# Patient Record
Sex: Female | Born: 1980 | Race: Asian | Hispanic: No | Marital: Married | Smoking: Never smoker
Health system: Southern US, Community
[De-identification: ages and names within clinical notes are randomized; demographics above are authoritative.]

## PROBLEM LIST (undated history)

## (undated) ENCOUNTER — Inpatient Hospital Stay (HOSPITAL_COMMUNITY): Payer: Self-pay

## (undated) DIAGNOSIS — M069 Rheumatoid arthritis, unspecified: Secondary | ICD-10-CM

## (undated) HISTORY — DX: Rheumatoid arthritis, unspecified: M06.9

---

## 2012-10-16 LAB — OB RESULTS CONSOLE GC/CHLAMYDIA
Chlamydia: NEGATIVE
Gonorrhea: NEGATIVE

## 2012-11-18 LAB — OB RESULTS CONSOLE ABO/RH: RH TYPE: POSITIVE

## 2012-11-18 LAB — OB RESULTS CONSOLE ANTIBODY SCREEN: Antibody Screen: NEGATIVE

## 2012-11-18 LAB — OB RESULTS CONSOLE HEPATITIS B SURFACE ANTIGEN: HEP B S AG: NEGATIVE

## 2012-11-18 LAB — OB RESULTS CONSOLE HIV ANTIBODY (ROUTINE TESTING): HIV: NONREACTIVE

## 2012-11-18 LAB — OB RESULTS CONSOLE RUBELLA ANTIBODY, IGM: RUBELLA: IMMUNE

## 2012-11-18 LAB — OB RESULTS CONSOLE RPR: RPR: NONREACTIVE

## 2013-03-25 ENCOUNTER — Inpatient Hospital Stay (HOSPITAL_COMMUNITY): Payer: BC Managed Care – PPO

## 2013-03-25 ENCOUNTER — Encounter (HOSPITAL_COMMUNITY): Payer: Self-pay

## 2013-03-25 ENCOUNTER — Inpatient Hospital Stay (HOSPITAL_COMMUNITY)
Admission: AD | Admit: 2013-03-25 | Discharge: 2013-03-25 | Disposition: A | Discharge status: Home / Self Care | Payer: BC Managed Care – PPO | Source: Ambulatory Visit | Attending: Obstetrics and Gynecology | Admitting: Obstetrics and Gynecology

## 2013-03-25 DIAGNOSIS — M549 Dorsalgia, unspecified: Secondary | ICD-10-CM

## 2013-03-25 DIAGNOSIS — O9989 Other specified diseases and conditions complicating pregnancy, childbirth and the puerperium: Secondary | ICD-10-CM

## 2013-03-25 DIAGNOSIS — M545 Low back pain, unspecified: Secondary | ICD-10-CM | POA: Insufficient documentation

## 2013-03-25 DIAGNOSIS — O99891 Other specified diseases and conditions complicating pregnancy: Secondary | ICD-10-CM | POA: Insufficient documentation

## 2013-03-25 DIAGNOSIS — Y92410 Unspecified street and highway as the place of occurrence of the external cause: Secondary | ICD-10-CM | POA: Insufficient documentation

## 2013-03-25 MED ORDER — CYCLOBENZAPRINE HCL 10 MG PO TABS: 10.0000 mg | ORAL_TABLET | Freq: Every day | ORAL | Status: DC

## 2013-03-25 MED ORDER — ACETAMINOPHEN 325 MG PO TABS
650.0000 mg | ORAL_TABLET | Freq: Once | ORAL | Status: AC
  Administered 2013-03-25: 650 mg via ORAL
  Filled 2013-03-25: qty 2
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
## 2013-05-01 LAB — OB RESULTS CONSOLE GBS: GBS: NEGATIVE

## 2013-05-25 ENCOUNTER — Encounter (HOSPITAL_COMMUNITY): Payer: Self-pay | Admitting: *Deleted

## 2013-05-25 ENCOUNTER — Inpatient Hospital Stay (HOSPITAL_COMMUNITY)
Admission: AD | Admit: 2013-05-25 | Discharge: 2013-05-28 | DRG: 765 | Disposition: A | Discharge status: Home / Self Care | Payer: BC Managed Care – PPO | Source: Ambulatory Visit | Attending: Obstetrics and Gynecology | Admitting: Obstetrics and Gynecology

## 2013-05-25 DIAGNOSIS — O479 False labor, unspecified: Secondary | ICD-10-CM

## 2013-05-25 DIAGNOSIS — Z9889 Other specified postprocedural states: Secondary | ICD-10-CM

## 2013-05-25 DIAGNOSIS — O4290 Premature rupture of membranes, unspecified as to length of time between rupture and onset of labor, unspecified weeks of gestation: Secondary | ICD-10-CM | POA: Diagnosis present

## 2013-05-25 LAB — CBC
HCT: 35.3 % — ABNORMAL LOW (ref 36.0–46.0)
Hemoglobin: 11.8 g/dL — ABNORMAL LOW (ref 12.0–15.0)
MCH: 27.9 pg (ref 26.0–34.0)
MCHC: 33.4 g/dL (ref 30.0–36.0)
MCV: 83.5 fL (ref 78.0–100.0)
PLATELETS: 176 10*3/uL (ref 150–400)
RBC: 4.23 MIL/uL (ref 3.87–5.11)
RDW: 13.1 % (ref 11.5–15.5)
WBC: 6.2 10*3/uL (ref 4.0–10.5)

## 2013-05-25 LAB — POCT FERN TEST: POCT FERN TEST: POSITIVE

## 2013-05-25 MED ORDER — LIDOCAINE HCL (PF) 1 % IJ SOLN: 30.0000 mL | INTRAMUSCULAR | Status: DC | PRN

## 2013-05-25 MED ORDER — OXYTOCIN BOLUS FROM INFUSION: 500.0000 mL | INTRAVENOUS | Status: DC

## 2013-05-25 MED ORDER — LACTATED RINGERS IV SOLN
500.0000 mL | INTRAVENOUS | Status: DC | PRN
  Administered 2013-05-26 (×3): 300 mL via INTRAVENOUS

## 2013-05-25 MED ORDER — OXYCODONE-ACETAMINOPHEN 5-325 MG PO TABS: 1.0000 | ORAL_TABLET | ORAL | Status: DC | PRN

## 2013-05-25 MED ORDER — CITRIC ACID-SODIUM CITRATE 334-500 MG/5ML PO SOLN
30.0000 mL | ORAL | Status: DC | PRN
  Administered 2013-05-26: 30 mL via ORAL
  Filled 2013-05-25: qty 15

## 2013-05-25 MED ORDER — LACTATED RINGERS IV SOLN
INTRAVENOUS | Status: DC
  Administered 2013-05-25 – 2013-05-26 (×6): via INTRAVENOUS

## 2013-05-25 MED ORDER — ACETAMINOPHEN 325 MG PO TABS: 650.0000 mg | ORAL_TABLET | ORAL | Status: DC | PRN

## 2013-05-25 MED ORDER — OXYTOCIN 40 UNITS IN LACTATED RINGERS INFUSION - SIMPLE MED
1.0000 m[IU]/min | INTRAVENOUS | Status: DC
  Administered 2013-05-25: 2 m[IU]/min via INTRAVENOUS
  Filled 2013-05-25: qty 1000

## 2013-05-25 MED ORDER — ONDANSETRON HCL 4 MG/2ML IJ SOLN: 4.0000 mg | Freq: Four times a day (QID) | INTRAMUSCULAR | Status: DC | PRN

## 2013-05-25 MED ORDER — OXYTOCIN 40 UNITS IN LACTATED RINGERS INFUSION - SIMPLE MED: 62.5000 mL/h | INTRAVENOUS | Status: DC

## 2013-05-25 MED ORDER — IBUPROFEN 600 MG PO TABS: 600.0000 mg | ORAL_TABLET | Freq: Four times a day (QID) | ORAL | Status: DC | PRN

## 2013-05-25 MED ORDER — TERBUTALINE SULFATE 1 MG/ML IJ SOLN: 0.2500 mg | Freq: Once | INTRAMUSCULAR | Status: AC | PRN

## 2013-05-25 MED ORDER — FLEET ENEMA 7-19 GM/118ML RE ENEM: 1.0000 | ENEMA | RECTAL | Status: DC | PRN

## 2013-05-25 MED ORDER — BUTORPHANOL TARTRATE 1 MG/ML IJ SOLN
1.0000 mg | Freq: Once | INTRAMUSCULAR | Status: AC
  Administered 2013-05-25: 1 mg via INTRAVENOUS
  Filled 2013-05-25: qty 1

## 2013-05-25 NOTE — MAU Note (Signed)
Pt states around 0700 this am noted clear lof and has had small tricles of fluid since then with what appears to be mucus. Was 2cm at last exam.

## 2013-05-25 NOTE — Plan of Care (Signed)
Problem: Consults Goal: Birthing Suites Patient Information Press F2 to bring up selections list Outcome: Completed/Met Date Met:  05/25/13  Pt 37-[redacted] weeks EGA

## 2013-05-25 NOTE — H&P (Signed)
33 y.o. 4670w6d  G1P0 comes in c/o LOF starting this am.  Otherwise has good fetal movement and no bleeding.  History reviewed. No pertinent past medical history. History reviewed. No pertinent past surgical history.  OB History  Gravida Para Term Preterm AB SAB TAB Ectopic Multiple Living  1             # Outcome Date GA Lbr Len/2nd Weight Sex Delivery Anes PTL Lv  1 CUR               History   Social History  . Marital Status: Married    Spouse Name: N/A    Number of Children: N/A  . Years of Education: N/A   Occupational History  . Not on file.   Social History Main Topics  . Smoking status: Never Smoker   . Smokeless tobacco: Never Used  . Alcohol Use: No  . Drug Use: No  . Sexual Activity: Yes   Other Topics Concern  . Not on file   Social History Narrative  . No narrative on file   Review of patient's allergies indicates no known allergies.    Prenatal Transfer Tool  Maternal Diabetes: No Genetic Screening: Abnormal:  Results: Other: 1:24 increased DS risk on first tri screen, NIPT low risk Maternal Ultrasounds/Referrals: Normal Fetal Ultrasounds or other Referrals:  none Maternal Substance Abuse:  No Significant Maternal Medications:  None Significant Maternal Lab Results: Lab values include: Group B Strep negative  Other PNC: uncomplicated.    Filed Vitals:   05/25/13 1747  BP: 133/83  Pulse: 95  Temp: 98.4 F (36.9 C)  Resp: 18     Lungs/Cor:  NAD Abdomen:  soft, gravid Ex:  no cords, erythema SVE:  1.5/50 FHTs:  135, good STV, NST R Toco:  q8   A/P   Admit with PROM  Pitocin 2x2  GBS Neg  Other routine care  Epidural upon request  Philip AspenALLAHAN, Shanteria Laye

## 2013-05-25 NOTE — MAU Provider Note (Signed)
HPI:  [name] is a 33 y.o. female G1P0 at 5320w6d who presents with possible ROM. Pt had a gush of clear fluid this morning and continued to "leak throughout the day".  The patient has noticed a small amount of fluid in her underwear throughout the day. She is feeling some pain, however nothing consistent. She reports good fetal movement.    Objective:  GENERAL: Well-developed, well-nourished female in no acute distress.  HEENT: Normocephalic, atraumatic.   LUNGS: Effort normal HEART: Regular rate  SKIN: Warm, dry and without erythema PSYCH: Normal mood and affect  Filed Vitals:   05/25/13 1747  BP: 133/83  Pulse: 95  Temp: 98.4 F (36.9 C)  Resp: 18    Speculum exam: Vagina - Small amount of brown mucus like discharge discharge, no odor. Scant amount of pink tinge fluid in canal  Cervix - No contact bleeding, moderate amount of mucus like discharge at os.  Bimanual exam: Dilation: 1.5 Effacement (%): 50 Cervical Position: Anterior Station: -2 Exam by:: J. Rasch NP Chaperone present for exam.   Fern slide: Positive fern slide for ROM. RN to call Dr. Claiborne Billingsallahan for admit orders.    Iona HansenJennifer Irene Rasch, NP 05/25/2013 7:06 PM

## 2013-05-26 ENCOUNTER — Encounter (HOSPITAL_COMMUNITY)
Admission: AD | Disposition: A | Discharge status: Home / Self Care | Payer: Self-pay | Source: Ambulatory Visit | Attending: Obstetrics and Gynecology

## 2013-05-26 ENCOUNTER — Encounter (HOSPITAL_COMMUNITY): Payer: Self-pay | Admitting: Anesthesiology

## 2013-05-26 ENCOUNTER — Encounter (HOSPITAL_COMMUNITY): Payer: BC Managed Care – PPO | Admitting: Anesthesiology

## 2013-05-26 ENCOUNTER — Inpatient Hospital Stay (HOSPITAL_COMMUNITY): Payer: BC Managed Care – PPO | Admitting: Anesthesiology

## 2013-05-26 LAB — TYPE AND SCREEN
ABO/RH(D): AB POS
Antibody Screen: NEGATIVE

## 2013-05-26 LAB — RPR: RPR Ser Ql: NONREACTIVE

## 2013-05-26 LAB — ABO/RH: ABO/RH(D): AB POS

## 2013-05-26 SURGERY — Surgical Case
Anesthesia: Epidural | Site: Abdomen

## 2013-05-26 MED ORDER — MEPERIDINE HCL 25 MG/ML IJ SOLN
INTRAMUSCULAR | Status: DC | PRN
  Administered 2013-05-26: 25 mg via INTRAVENOUS

## 2013-05-26 MED ORDER — SODIUM BICARBONATE 8.4 % IV SOLN
INTRAVENOUS | Status: DC | PRN
  Administered 2013-05-26 (×2): 5 mL via EPIDURAL

## 2013-05-26 MED ORDER — CEFAZOLIN SODIUM-DEXTROSE 2-3 GM-% IV SOLR
INTRAVENOUS | Status: AC
  Filled 2013-05-26: qty 50

## 2013-05-26 MED ORDER — KETOROLAC TROMETHAMINE 30 MG/ML IJ SOLN
30.0000 mg | Freq: Four times a day (QID) | INTRAMUSCULAR | Status: AC | PRN
Start: 2013-05-26 — End: 2013-05-27
  Administered 2013-05-26: 30 mg via INTRAVENOUS

## 2013-05-26 MED ORDER — LACTATED RINGERS IV SOLN
INTRAVENOUS | Status: DC | PRN
  Administered 2013-05-26: 21:00:00 via INTRAVENOUS

## 2013-05-26 MED ORDER — LIDOCAINE HCL (PF) 1 % IJ SOLN
INTRAMUSCULAR | Status: DC | PRN
Start: 2013-05-26 — End: 2013-05-26
  Administered 2013-05-26 (×2): 4 mL

## 2013-05-26 MED ORDER — CEFAZOLIN SODIUM-DEXTROSE 2-3 GM-% IV SOLR
INTRAVENOUS | Status: DC | PRN
  Administered 2013-05-26: 2 g via INTRAVENOUS

## 2013-05-26 MED ORDER — MORPHINE SULFATE (PF) 0.5 MG/ML IJ SOLN
INTRAMUSCULAR | Status: DC | PRN
  Administered 2013-05-26: 3 mg via EPIDURAL

## 2013-05-26 MED ORDER — FENTANYL 2.5 MCG/ML BUPIVACAINE 1/10 % EPIDURAL INFUSION (WH - ANES)
INTRAMUSCULAR | Status: DC | PRN
  Administered 2013-05-26: 14 mL/h via EPIDURAL

## 2013-05-26 MED ORDER — SCOPOLAMINE 1 MG/3DAYS TD PT72
1.0000 | MEDICATED_PATCH | Freq: Once | TRANSDERMAL | Status: DC
  Administered 2013-05-26: 1.5 mg via TRANSDERMAL

## 2013-05-26 MED ORDER — ONDANSETRON HCL 4 MG/2ML IJ SOLN
INTRAMUSCULAR | Status: DC | PRN
  Administered 2013-05-26: 4 mg via INTRAVENOUS

## 2013-05-26 MED ORDER — DEXAMETHASONE SODIUM PHOSPHATE 10 MG/ML IJ SOLN
INTRAMUSCULAR | Status: DC | PRN
  Administered 2013-05-26: 10 mg via INTRAVENOUS

## 2013-05-26 MED ORDER — KETOROLAC TROMETHAMINE 30 MG/ML IJ SOLN
INTRAMUSCULAR | Status: AC
  Filled 2013-05-26: qty 1

## 2013-05-26 MED ORDER — 0.9 % SODIUM CHLORIDE (POUR BTL) OPTIME
TOPICAL | Status: DC | PRN
Start: 2013-05-26 — End: 2013-05-26
  Administered 2013-05-26: 1000 mL

## 2013-05-26 MED ORDER — FENTANYL 2.5 MCG/ML BUPIVACAINE 1/10 % EPIDURAL INFUSION (WH - ANES)
14.0000 mL/h | INTRAMUSCULAR | Status: DC | PRN
  Administered 2013-05-26 (×3): 14 mL/h via EPIDURAL
  Filled 2013-05-26 (×4): qty 125

## 2013-05-26 MED ORDER — DIPHENHYDRAMINE HCL 50 MG/ML IJ SOLN: 12.5000 mg | INTRAMUSCULAR | Status: DC | PRN

## 2013-05-26 MED ORDER — PHENYLEPHRINE 40 MCG/ML (10ML) SYRINGE FOR IV PUSH (FOR BLOOD PRESSURE SUPPORT)
80.0000 ug | PREFILLED_SYRINGE | INTRAVENOUS | Status: DC | PRN
  Filled 2013-05-26: qty 10

## 2013-05-26 MED ORDER — KETOROLAC TROMETHAMINE 30 MG/ML IJ SOLN
30.0000 mg | Freq: Four times a day (QID) | INTRAMUSCULAR | Status: AC | PRN
Start: 2013-05-26 — End: 2013-05-27

## 2013-05-26 MED ORDER — MORPHINE SULFATE 0.5 MG/ML IJ SOLN
INTRAMUSCULAR | Status: AC
  Filled 2013-05-26: qty 10

## 2013-05-26 MED ORDER — FENTANYL CITRATE 0.05 MG/ML IJ SOLN: 25.0000 ug | INTRAMUSCULAR | Status: DC | PRN

## 2013-05-26 MED ORDER — ONDANSETRON HCL 4 MG/2ML IJ SOLN
INTRAMUSCULAR | Status: AC
  Filled 2013-05-26: qty 2

## 2013-05-26 MED ORDER — OXYTOCIN 10 UNIT/ML IJ SOLN
40.0000 [IU] | INTRAVENOUS | Status: DC | PRN
  Administered 2013-05-26: 40 [IU] via INTRAVENOUS

## 2013-05-26 MED ORDER — SCOPOLAMINE 1 MG/3DAYS TD PT72
MEDICATED_PATCH | TRANSDERMAL | Status: AC
  Filled 2013-05-26: qty 1

## 2013-05-26 MED ORDER — SODIUM CHLORIDE 0.9 % IV SOLN
2.0000 g | Freq: Four times a day (QID) | INTRAVENOUS | Status: DC
  Administered 2013-05-26: 2 g via INTRAVENOUS
  Filled 2013-05-26 (×5): qty 2000

## 2013-05-26 MED ORDER — MEPERIDINE HCL 25 MG/ML IJ SOLN
INTRAMUSCULAR | Status: AC
  Filled 2013-05-26: qty 1

## 2013-05-26 MED ORDER — DEXAMETHASONE SODIUM PHOSPHATE 10 MG/ML IJ SOLN
INTRAMUSCULAR | Status: AC
  Filled 2013-05-26: qty 1

## 2013-05-26 MED ORDER — MEPERIDINE HCL 25 MG/ML IJ SOLN
6.2500 mg | INTRAMUSCULAR | Status: DC | PRN
  Administered 2013-05-26: 6.25 mg via INTRAVENOUS

## 2013-05-26 MED ORDER — LACTATED RINGERS IV SOLN
500.0000 mL | Freq: Once | INTRAVENOUS | Status: AC
  Administered 2013-05-26: 500 mL via INTRAVENOUS

## 2013-05-26 MED ORDER — PHENYLEPHRINE 40 MCG/ML (10ML) SYRINGE FOR IV PUSH (FOR BLOOD PRESSURE SUPPORT): 80.0000 ug | PREFILLED_SYRINGE | INTRAVENOUS | Status: DC | PRN

## 2013-05-26 MED ORDER — ACETAMINOPHEN 500 MG PO TABS
1000.0000 mg | ORAL_TABLET | Freq: Four times a day (QID) | ORAL | Status: DC | PRN
  Administered 2013-05-26: 1000 mg via ORAL
  Filled 2013-05-26: qty 2

## 2013-05-26 MED ORDER — EPHEDRINE 5 MG/ML INJ: 10.0000 mg | INTRAVENOUS | Status: DC | PRN

## 2013-05-26 MED ORDER — EPHEDRINE 5 MG/ML INJ
10.0000 mg | INTRAVENOUS | Status: DC | PRN
  Filled 2013-05-26: qty 4

## 2013-05-26 MED ORDER — OXYTOCIN 10 UNIT/ML IJ SOLN
INTRAMUSCULAR | Status: AC
  Filled 2013-05-26: qty 4

## 2013-05-26 SURGICAL SUPPLY — 36 items
BENZOIN TINCTURE PRP APPL 2/3 (GAUZE/BANDAGES/DRESSINGS) ×3 IMPLANT
CLAMP CORD UMBIL (MISCELLANEOUS) ×3 IMPLANT
CLOSURE WOUND 1/2 X4 (GAUZE/BANDAGES/DRESSINGS) ×1
CLOTH BEACON ORANGE TIMEOUT ST (SAFETY) ×3 IMPLANT
DRAPE LG THREE QUARTER DISP (DRAPES) ×3 IMPLANT
DRSG OPSITE POSTOP 4X10 (GAUZE/BANDAGES/DRESSINGS) ×3 IMPLANT
DURAPREP 26ML APPLICATOR (WOUND CARE) ×3 IMPLANT
ELECT REM PT RETURN 9FT ADLT (ELECTROSURGICAL) ×3
ELECTRODE REM PT RTRN 9FT ADLT (ELECTROSURGICAL) ×1 IMPLANT
EXTRACTOR VACUUM BELL STYLE (SUCTIONS) IMPLANT
GLOVE BIO SURGEON STRL SZ7 (GLOVE) ×3 IMPLANT
GOWN STRL REUS W/ TWL XL LVL3 (GOWN DISPOSABLE) ×1 IMPLANT
GOWN STRL REUS W/TWL LRG LVL3 (GOWN DISPOSABLE) ×3 IMPLANT
GOWN STRL REUS W/TWL XL LVL3 (GOWN DISPOSABLE) ×2
KIT ABG SYR 3ML LUER SLIP (SYRINGE) IMPLANT
NEEDLE HYPO 25X5/8 SAFETYGLIDE (NEEDLE) IMPLANT
NS IRRIG 1000ML POUR BTL (IV SOLUTION) ×3 IMPLANT
PACK C SECTION WH (CUSTOM PROCEDURE TRAY) ×3 IMPLANT
PAD ABD 7.5X8 STRL (GAUZE/BANDAGES/DRESSINGS) ×3 IMPLANT
PAD OB MATERNITY 4.3X12.25 (PERSONAL CARE ITEMS) ×3 IMPLANT
RETRACTOR WND ALEXIS 25 LRG (MISCELLANEOUS) ×1 IMPLANT
RTRCTR WOUND ALEXIS 25CM LRG (MISCELLANEOUS) ×3
STAPLER VISISTAT 35W (STAPLE) IMPLANT
STRIP CLOSURE SKIN 1/2X4 (GAUZE/BANDAGES/DRESSINGS) ×2 IMPLANT
SUT MNCRL 0 VIOLET CTX 36 (SUTURE) ×3 IMPLANT
SUT MONOCRYL 0 CTX 36 (SUTURE) ×6
SUT PDS AB 0 CTX 60 (SUTURE) IMPLANT
SUT PLAIN 2 0 XLH (SUTURE) ×3 IMPLANT
SUT VIC AB 0 CT1 27 (SUTURE) ×4
SUT VIC AB 0 CT1 27XBRD ANBCTR (SUTURE) ×2 IMPLANT
SUT VIC AB 2-0 CT1 27 (SUTURE) ×4
SUT VIC AB 2-0 CT1 TAPERPNT 27 (SUTURE) ×2 IMPLANT
SUT VIC AB 4-0 KS 27 (SUTURE) ×3 IMPLANT
TOWEL OR 17X24 6PK STRL BLUE (TOWEL DISPOSABLE) ×3 IMPLANT
TRAY FOLEY CATH 14FR (SET/KITS/TRAYS/PACK) IMPLANT
WATER STERILE IRR 1000ML POUR (IV SOLUTION) IMPLANT

## 2013-05-26 NOTE — Anesthesia Preprocedure Evaluation (Addendum)
Anesthesia Evaluation  Patient identified by MRN, date of birth, ID band Patient awake    Reviewed: Allergy & Precautions, H&P , Patient's Chart, lab work & pertinent test results  Airway Mallampati: III TM Distance: >3 FB Neck ROM: full    Dental no notable dental hx. (+) Teeth Intact   Pulmonary neg pulmonary ROS,  breath sounds clear to auscultation  Pulmonary exam normal       Cardiovascular negative cardio ROS  Rhythm:regular Rate:Normal     Neuro/Psych negative neurological ROS  negative psych ROS   GI/Hepatic negative GI ROS, Neg liver ROS,   Endo/Other  negative endocrine ROS  Renal/GU negative Renal ROS  negative genitourinary   Musculoskeletal   Abdominal Normal abdominal exam  (+)   Peds  Hematology negative hematology ROS (+)   Anesthesia Other Findings   Reproductive/Obstetrics (+) Pregnancy                           Anesthesia Physical Anesthesia Plan  ASA: II and emergent  Anesthesia Plan: Epidural   Post-op Pain Management:    Induction:   Airway Management Planned:   Additional Equipment:   Intra-op Plan:   Post-operative Plan:   Informed Consent: I have reviewed the patients History and Physical, chart, labs and discussed the procedure including the risks, benefits and alternatives for the proposed anesthesia with the patient or authorized representative who has indicated his/her understanding and acceptance.     Plan Discussed with: Anesthesiologist, CRNA and Surgeon  Anesthesia Plan Comments: (Patient for C/Section for failure to progress. Will use epidural for C/Section. )       Anesthesia Quick Evaluation

## 2013-05-26 NOTE — Anesthesia Procedure Notes (Signed)
Epidural Patient location during procedure: OB Start time: 05/26/2013 1:07 AM  Staffing Anesthesiologist: Connelly Spruell A. Performed by: anesthesiologist   Preanesthetic Checklist Completed: patient identified, site marked, surgical consent, pre-op evaluation, timeout performed, IV checked, risks and benefits discussed and monitors and equipment checked  Epidural Patient position: sitting Prep: site prepped and draped and DuraPrep Patient monitoring: continuous pulse ox and blood pressure Approach: midline Injection technique: LOR air  Needle:  Needle type: Tuohy  Needle gauge: 17 G Needle length: 9 cm and 9 Needle insertion depth: 4 cm Catheter type: closed end flexible Catheter size: 19 Gauge Catheter at skin depth: 9 cm Test dose: negative and Other  Assessment Events: blood not aspirated, injection not painful, no injection resistance, negative IV test and no paresthesia  Additional Notes Patient identified. Risks and benefits discussed including failed block, incomplete  Pain control, post dural puncture headache, nerve damage, paralysis, blood pressure Changes, nausea, vomiting, reactions to medications-both toxic and allergic and post Partum back pain. All questions were answered. Patient expressed understanding and wished to proceed. Sterile technique was used throughout procedure. Epidural site was Dressed with sterile barrier dressing. No paresthesias, signs of intravascular injection Or signs of intrathecal spread were encountered.  Patient was more comfortable after the epidural was dosed. Please see RN's note for documentation of vital signs and FHR which are stable.

## 2013-05-26 NOTE — Brief Op Note (Signed)
05/25/2013 - 05/26/2013  10:09 PM  PATIENT:  Christine Le  33 y.o. female  PRE-OPERATIVE DIAGNOSIS:  Nonreassuring Fetal Heart Rate, arrest of active phase  POST-OPERATIVE DIAGNOSIS:  Nonreassuring Fetal Heart Rate, arrest of active phase  PROCEDURE:  Procedure(s): Primary Cesarean Section Delivery Baby Boy @ 2129, Apgar 8/9 (N/A)  SURGEON:  Surgeon(s) and Role:    * Loney LaurenceMichelle A Nneoma Harral, MD - Primary   ANESTHESIA:   epidural  EBL:  Total I/O In: 1500 [I.V.:1500] Out: 1300 [Urine:500; Blood:800]   SPECIMEN:  No Specimen  DISPOSITION OF SPECIMEN:  N/A  COUNTS:  YES  TOURNIQUET:  * No tourniquets in log *  DICTATION: .Note written in EPIC  PLAN OF CARE: Admit to inpatient   PATIENT DISPOSITION:  PACU - hemodynamically stable.   Delay start of Pharmacological VTE agent (>24hrs) due to surgical blood loss or risk of bleeding: not applicable  Complications:  none Medications:  Ancef, Pitocin Findings:  Baby female, Apgars 8,9, weight P.   Normal tubes, ovaries and uterus seen.  Baby was skin to skin with mother after birth in the OR.  Technique:  After adequate epidural anesthesia was achieved, the patient was prepped and draped in usual sterile fashion.  A foley catheter was used to drain the bladder.  A pfannanstiel incision was made with the scalpel and carried down to the fascia with the bovie cautery. The fascia was incised in the midline with the scalpel and carried in a transverse curvilinear manner bilaterally.  The fascia was reflected superiorly and inferiorly off the rectus muscles and the muscles split in the midline.  A bowel free portion of the peritoneum was entered bluntly and then extended in a superior and inferior manner with good visualization of the bowel and bladder.  The Alexis instrument was then placed and the vesico-uterine fascia tented up and incised in a transverse curvilinear manner.  A 2 cm transverse incision was made in the very upper portion of the  lower uterine segment until the amnion was exposed.   The incision was extended transversely in a blunt manner.  Clear fluid was noted and the baby delivered in the vertex presentation without complication.  The baby was bulb suctioned and the cord was clamped and cut.  The baby was then handed to awaiting Neonatology.  The placenta was then delivered manually and the uterus cleared of all debris.  The uterine incision was then closed with a running lock stitch of 0 monocryl.  An imbricating layer of 0 monocryl was closed as well. Good hemostasis of the uterine incision was achieved and the abdomen was cleared with irrigation.  The peritoneum was closed with a running stitch of 2-0 vicryl.  This incorporated the rectus muscles as a separate layer.  The fascia was then closed with a running stitch of 0 vicryl.  The subcutaneous layer was closed with interrupted  stitches of 2-0 plain gut.  The skin was closed with 4-0 vicryl on a keith needle and steristrips.  The patient tolerated the procedure well and was returned to the recovery room in stable condition.  All counts were correct times three.  Donalee Gaumond A

## 2013-05-26 NOTE — Progress Notes (Signed)
Pt comfortable. FHTs 140-150s with G STV, NST R Toco q 2  SVE now 8-9/80/-1, slight edema of anterior cervix  Pt has temp now- start Amp and Tylenol.  Continue pit- FHTs reassuring.

## 2013-05-26 NOTE — Progress Notes (Signed)
Pt comfortable o/n. FHTs 120s, g STV, NST R   Toco q2 SVE 6/C/-1 per nurse  Continue.

## 2013-05-26 NOTE — Progress Notes (Signed)
SVE still 8-9 with edematous anterior lip and now moderate variables happening with each contraction, some longer than others.  Pt has not made change since 5 pm and now having non-reassuring FHTs.  Pt counseled about risks, benefits and alternatives of c/s and pt agrees to proceed.

## 2013-05-26 NOTE — Transfer of Care (Signed)
Immediate Anesthesia Transfer of Care Note  Patient: Christine Le  Procedure(s) Performed: Procedure(s): Primary Cesarean Section Delivery Baby Boy @ 2129, Apgar 8/9 (N/A)  Patient Location: PACU  Anesthesia Type:Epidural  Level of Consciousness: awake, alert , oriented and patient cooperative  Airway & Oxygen Therapy: Patient Spontanous Breathing  Post-op Assessment: Report given to PACU RN and Post -op Vital signs reviewed and stable  Post vital signs: Reviewed and stable  Complications: No apparent anesthesia complications

## 2013-05-26 NOTE — Consult Note (Signed)
Neonatology Note:  Attendance at C-section:  I was asked by Dr. Hiorvath to attend this primary C/S at term due to NRFHR. The mother is a G1P0 AB pos, GBS neg with an uncomplicated pregnancy. ROM 38 hours prior to delivery, fluid clear. Mother had a temperature to 100.7 degrees several hours before delivery and she received Ampicillin > 4 hours prior to delivery. Infant vigorous with good spontaneous cry and tone. Needed only minimal bulb suctioning. Ap 8/9. Lungs clear to ausc in DR. To CN to care of Pediatrician.  Christine Le C. Christine Helminiak, MD  

## 2013-05-26 NOTE — Anesthesia Postprocedure Evaluation (Signed)
  Anesthesia Post-op Note  Patient: Christine Le  Procedure(s) Performed: Procedure(s): Primary Cesarean Section Delivery Baby Boy @ 2129, Apgar 8/9 (N/A)  Patient Location: PACU  Anesthesia Type:Epidural  Level of Consciousness: awake, alert  and oriented  Airway and Oxygen Therapy: Patient Spontanous Breathing  Post-op Pain: none  Post-op Assessment: Post-op Vital signs reviewed, Patient's Cardiovascular Status Stable, Respiratory Function Stable, Patent Airway, No signs of Nausea or vomiting, Pain level controlled, No headache and No backache  Post-op Vital Signs: Reviewed and stable  Complications: No apparent anesthesia complications

## 2013-05-26 NOTE — Op Note (Signed)
05/25/2013 - 05/26/2013  10:09 PM  PATIENT:  Christine Le  32 y.o. female  PRE-OPERATIVE DIAGNOSIS:  Nonreassuring Fetal Heart Rate, arrest of active phase  POST-OPERATIVE DIAGNOSIS:  Nonreassuring Fetal Heart Rate, arrest of active phase  PROCEDURE:  Procedure(s): Primary Cesarean Section Delivery Baby Boy @ 2129, Apgar 8/9 (N/Le)  SURGEON:  Surgeon(s) and Role:    * Tally Mattox Le Chelsey Redondo, MD - Primary   ANESTHESIA:   epidural  EBL:  Total I/O In: 1500 [I.V.:1500] Out: 1300 [Urine:500; Blood:800]   SPECIMEN:  No Specimen  DISPOSITION OF SPECIMEN:  N/Le  COUNTS:  YES  TOURNIQUET:  * No tourniquets in log *  DICTATION: .Note written in EPIC  PLAN OF CARE: Admit to inpatient   PATIENT DISPOSITION:  PACU - hemodynamically stable.   Delay start of Pharmacological VTE agent (>24hrs) due to surgical blood loss or risk of bleeding: not applicable  Complications:  none Medications:  Ancef, Pitocin Findings:  Baby female, Apgars 8,9, weight P.   Normal tubes, ovaries and uterus seen.  Baby was skin to skin with mother after birth in the OR.  Technique:  After adequate epidural anesthesia was achieved, the patient was prepped and draped in usual sterile fashion.  Le foley catheter was used to drain the bladder.  Le pfannanstiel incision was made with the scalpel and carried down to the fascia with the bovie cautery. The fascia was incised in the midline with the scalpel and carried in Le transverse curvilinear manner bilaterally.  The fascia was reflected superiorly and inferiorly off the rectus muscles and the muscles split in the midline.  Le bowel free portion of the peritoneum was entered bluntly and then extended in Le superior and inferior manner with good visualization of the bowel and bladder.  The Alexis instrument was then placed and the vesico-uterine fascia tented up and incised in Le transverse curvilinear manner.  Le 2 cm transverse incision was made in the very upper portion of the  lower uterine segment until the amnion was exposed.   The incision was extended transversely in Le blunt manner.  Clear fluid was noted and the baby delivered in the vertex presentation without complication.  The baby was bulb suctioned and the cord was clamped and cut.  The baby was then handed to awaiting Neonatology.  The placenta was then delivered manually and the uterus cleared of all debris.  The uterine incision was then closed with Le running lock stitch of 0 monocryl.  An imbricating layer of 0 monocryl was closed as well. Good hemostasis of the uterine incision was achieved and the abdomen was cleared with irrigation.  The peritoneum was closed with Le running stitch of 2-0 vicryl.  This incorporated the rectus muscles as Le separate layer.  The fascia was then closed with Le running stitch of 0 vicryl.  The subcutaneous layer was closed with interrupted  stitches of 2-0 plain gut.  The skin was closed with 4-0 vicryl on Le keith needle and steristrips.  The patient tolerated the procedure well and was returned to the recovery room in stable condition.  All counts were correct times three.  Christine Le   

## 2013-05-27 ENCOUNTER — Encounter (HOSPITAL_COMMUNITY): Payer: Self-pay

## 2013-05-27 DIAGNOSIS — Z9889 Other specified postprocedural states: Secondary | ICD-10-CM

## 2013-05-27 HISTORY — DX: Other specified postprocedural states: Z98.890

## 2013-05-27 LAB — CBC
HEMATOCRIT: 29 % — AB (ref 36.0–46.0)
HEMOGLOBIN: 10 g/dL — AB (ref 12.0–15.0)
MCH: 28.3 pg (ref 26.0–34.0)
MCHC: 34.5 g/dL (ref 30.0–36.0)
MCV: 82.2 fL (ref 78.0–100.0)
Platelets: 166 10*3/uL (ref 150–400)
RBC: 3.53 MIL/uL — AB (ref 3.87–5.11)
RDW: 13.3 % (ref 11.5–15.5)
WBC: 19.7 10*3/uL — AB (ref 4.0–10.5)

## 2013-05-27 MED ORDER — METHYLERGONOVINE MALEATE 0.2 MG PO TABS: 0.2000 mg | ORAL_TABLET | ORAL | Status: DC | PRN

## 2013-05-27 MED ORDER — DIPHENHYDRAMINE HCL 50 MG/ML IJ SOLN: 12.5000 mg | INTRAMUSCULAR | Status: DC | PRN

## 2013-05-27 MED ORDER — SODIUM CHLORIDE 0.9 % IJ SOLN
3.0000 mL | INTRAMUSCULAR | Status: DC | PRN
Start: 2013-05-27 — End: 2013-05-28

## 2013-05-27 MED ORDER — LANOLIN HYDROUS EX OINT: 1.0000 "application " | TOPICAL_OINTMENT | CUTANEOUS | Status: DC | PRN

## 2013-05-27 MED ORDER — METOCLOPRAMIDE HCL 5 MG/ML IJ SOLN: 10.0000 mg | Freq: Three times a day (TID) | INTRAMUSCULAR | Status: DC | PRN

## 2013-05-27 MED ORDER — ONDANSETRON HCL 4 MG PO TABS: 4.0000 mg | ORAL_TABLET | ORAL | Status: DC | PRN

## 2013-05-27 MED ORDER — SIMETHICONE 80 MG PO CHEW
80.0000 mg | CHEWABLE_TABLET | ORAL | Status: DC
  Administered 2013-05-27: 80 mg via ORAL
  Filled 2013-05-27: qty 1

## 2013-05-27 MED ORDER — FERROUS SULFATE 325 (65 FE) MG PO TABS
325.0000 mg | ORAL_TABLET | Freq: Two times a day (BID) | ORAL | Status: DC
  Administered 2013-05-27 – 2013-05-28 (×3): 325 mg via ORAL
  Filled 2013-05-27 (×3): qty 1

## 2013-05-27 MED ORDER — NALBUPHINE HCL 10 MG/ML IJ SOLN: 5.0000 mg | INTRAMUSCULAR | Status: DC | PRN

## 2013-05-27 MED ORDER — SIMETHICONE 80 MG PO CHEW: 80.0000 mg | CHEWABLE_TABLET | ORAL | Status: DC | PRN

## 2013-05-27 MED ORDER — TETANUS-DIPHTH-ACELL PERTUSSIS 5-2.5-18.5 LF-MCG/0.5 IM SUSP: 0.5000 mL | Freq: Once | INTRAMUSCULAR | Status: DC

## 2013-05-27 MED ORDER — OXYCODONE-ACETAMINOPHEN 5-325 MG PO TABS: 1.0000 | ORAL_TABLET | ORAL | Status: DC | PRN

## 2013-05-27 MED ORDER — DIPHENHYDRAMINE HCL 25 MG PO CAPS: 25.0000 mg | ORAL_CAPSULE | ORAL | Status: DC | PRN

## 2013-05-27 MED ORDER — BISACODYL 10 MG RE SUPP: 10.0000 mg | Freq: Every day | RECTAL | Status: DC | PRN

## 2013-05-27 MED ORDER — ONDANSETRON HCL 4 MG/2ML IJ SOLN: 4.0000 mg | Freq: Three times a day (TID) | INTRAMUSCULAR | Status: DC | PRN

## 2013-05-27 MED ORDER — FLEET ENEMA 7-19 GM/118ML RE ENEM: 1.0000 | ENEMA | Freq: Every day | RECTAL | Status: DC | PRN

## 2013-05-27 MED ORDER — NALOXONE HCL 1 MG/ML IJ SOLN
1.0000 ug/kg/h | INTRAVENOUS | Status: DC | PRN
  Filled 2013-05-27: qty 2

## 2013-05-27 MED ORDER — SIMETHICONE 80 MG PO CHEW
80.0000 mg | CHEWABLE_TABLET | Freq: Three times a day (TID) | ORAL | Status: DC
Start: 2013-05-27 — End: 2013-05-28
  Administered 2013-05-27 – 2013-05-28 (×5): 80 mg via ORAL
  Filled 2013-05-27 (×4): qty 1

## 2013-05-27 MED ORDER — NALOXONE HCL 0.4 MG/ML IJ SOLN: 0.4000 mg | INTRAMUSCULAR | Status: DC | PRN

## 2013-05-27 MED ORDER — DIPHENHYDRAMINE HCL 25 MG PO CAPS: 25.0000 mg | ORAL_CAPSULE | Freq: Four times a day (QID) | ORAL | Status: DC | PRN

## 2013-05-27 MED ORDER — OXYTOCIN 40 UNITS IN LACTATED RINGERS INFUSION - SIMPLE MED: 62.5000 mL/h | INTRAVENOUS | Status: AC

## 2013-05-27 MED ORDER — LACTATED RINGERS IV SOLN
INTRAVENOUS | Status: DC
  Administered 2013-05-27 (×2): via INTRAVENOUS

## 2013-05-27 MED ORDER — DIBUCAINE 1 % RE OINT: 1.0000 "application " | TOPICAL_OINTMENT | RECTAL | Status: DC | PRN

## 2013-05-27 MED ORDER — ONDANSETRON HCL 4 MG/2ML IJ SOLN
4.0000 mg | INTRAMUSCULAR | Status: DC | PRN
Start: 2013-05-27 — End: 2013-05-28

## 2013-05-27 MED ORDER — METHYLERGONOVINE MALEATE 0.2 MG/ML IJ SOLN: 0.2000 mg | INTRAMUSCULAR | Status: DC | PRN

## 2013-05-27 MED ORDER — DIPHENHYDRAMINE HCL 50 MG/ML IJ SOLN: 25.0000 mg | INTRAMUSCULAR | Status: DC | PRN

## 2013-05-27 MED ORDER — MEASLES, MUMPS & RUBELLA VAC ~~LOC~~ INJ
0.5000 mL | INJECTION | Freq: Once | SUBCUTANEOUS | Status: DC
  Filled 2013-05-27: qty 0.5

## 2013-05-27 MED ORDER — PRENATAL MULTIVITAMIN CH
1.0000 | ORAL_TABLET | Freq: Every day | ORAL | Status: DC
  Administered 2013-05-27 – 2013-05-28 (×2): 1 via ORAL
  Filled 2013-05-27 (×2): qty 1

## 2013-05-27 MED ORDER — ZOLPIDEM TARTRATE 5 MG PO TABS: 5.0000 mg | ORAL_TABLET | Freq: Every evening | ORAL | Status: DC | PRN

## 2013-05-27 MED ORDER — WITCH HAZEL-GLYCERIN EX PADS: 1.0000 "application " | MEDICATED_PAD | CUTANEOUS | Status: DC | PRN

## 2013-05-27 MED ORDER — SENNOSIDES-DOCUSATE SODIUM 8.6-50 MG PO TABS
2.0000 | ORAL_TABLET | ORAL | Status: DC
Start: 2013-05-27 — End: 2013-05-28
  Administered 2013-05-27: 2 via ORAL
  Filled 2013-05-27: qty 2

## 2013-05-27 MED ORDER — MENTHOL 3 MG MT LOZG: 1.0000 | LOZENGE | OROMUCOSAL | Status: DC | PRN

## 2013-05-27 MED ORDER — IBUPROFEN 600 MG PO TABS
600.0000 mg | ORAL_TABLET | Freq: Four times a day (QID) | ORAL | Status: DC
  Administered 2013-05-27 – 2013-05-28 (×7): 600 mg via ORAL
  Filled 2013-05-27 (×7): qty 1

## 2013-05-27 NOTE — Progress Notes (Signed)
Subjective: Postpartum Day 1: Cesarean Delivery Patient reports pain controlled, bleeding appropriate. Tolerating po. Foley catheter in place, has yet to void  Objective: Vital signs in last 24 hours: Temp:  [98.4 F (36.9 C)-100 F (37.8 C)] 98.7 F (37.1 C) (02/04 2000) Pulse Rate:  [70-111] 83 (02/04 2000) Resp:  [13-26] 18 (02/04 2000) BP: (100-150)/(64-93) 111/70 mmHg (02/04 2000) SpO2:  [94 %-100 %] 98 % (02/04 2000)  Physical Exam:  General: alert, cooperative and appears stated age Lochia: appropriate Uterine Fundus: firm Incision: dressing intact DVT Evaluation: No evidence of DVT seen on physical exam.   Recent Labs  05/25/13 1925 05/27/13 0645  HGB 11.8* 10.0*  HCT 35.3* 29.0*    Assessment/Plan: Status post Cesarean section. Doing well postoperatively.  Continue current care. Pt declines neonatal circ  Haidan Nhan H. 05/27/2013, 8:24 PM

## 2013-05-27 NOTE — Anesthesia Postprocedure Evaluation (Signed)
  Anesthesia Post-op Note  Patient: Christine Le  Procedure(s) Performed: Procedure(s): Primary Cesarean Section Delivery Baby Boy @ 2129, Apgar 8/9 (N/A)  Patient Location: Mother/Baby  Anesthesia Type:Epidural  Level of Consciousness: awake  Airway and Oxygen Therapy: Patient Spontanous Breathing  Post-op Pain: none  Post-op Assessment: Patient's Cardiovascular Status Stable, Respiratory Function Stable, Patent Airway, No signs of Nausea or vomiting, Adequate PO intake, Pain level controlled, No headache, No backache, No residual numbness and No residual motor weakness  Post-op Vital Signs: Reviewed and stable  Complications: No apparent anesthesia complications

## 2013-05-28 ENCOUNTER — Ambulatory Visit: Payer: Self-pay

## 2013-05-28 MED ORDER — OXYCODONE-ACETAMINOPHEN 5-325 MG PO TABS: 1.0000 | ORAL_TABLET | ORAL | Status: DC | PRN

## 2013-05-28 NOTE — Discharge Summary (Signed)
Obstetric Discharge Summary Reason for Admission: rupture of membranes Prenatal Procedures: ultrasound Intrapartum Procedures: cesarean: low cervical, transverse Postpartum Procedures: none Complications-Operative and Postpartum: none Hemoglobin  Date Value Range Status  05/27/2013 10.0* 12.0 - 15.0 g/dL Final     HCT  Date Value Range Status  05/27/2013 29.0* 36.0 - 46.0 % Final    Physical Exam:  General: alert Lochia: appropriate Uterine Fundus: firm Incision: healing well DVT Evaluation: No evidence of DVT seen on physical exam.  Discharge Diagnoses: Term Pregnancy-delivered and CPD  Discharge Information: Date: 05/28/2013 Activity: pelvic rest Diet: routine Medications: Ibuprofen and Percocet Condition: stable Instructions: refer to practice specific booklet Discharge to: home Follow-up Information   Follow up with HORVATH,MICHELLE A, MD. Schedule an appointment as soon as possible for a visit in 4 weeks.   Specialty:  Obstetrics and Gynecology   Contact information:   [address] [place] [address] [phone]       Newborn Data: Live born female  Birth Weight: 8 lb 0.6 oz (3645 g) APGAR: 8, 9  Home with mother.  Dellar Traber E 05/28/2013, 8:33 AM

## 2013-05-28 NOTE — Progress Notes (Signed)
POD# 2 Pt states that she is doing well. She would like discharged to home. VSSAF IMP/ Doing well. Plan/ Will discharge to home.

## 2013-05-28 NOTE — Anesthesia Postprocedure Evaluation (Signed)
  Anesthesia Post-op Note  Patient: Christine Le  Procedure(s) Performed: Procedure(s): Primary Cesarean Section Delivery Baby Boy @ 2129, Apgar 8/9 (N/A)  Patient Location: Mother/Baby  Anesthesia Type:Epidural  Level of Consciousness: awake, alert  and oriented  Airway and Oxygen Therapy: Patient Spontanous Breathing  Post-op Pain: none  Post-op Assessment: Post-op Vital signs reviewed, Patient's Cardiovascular Status Stable, Respiratory Function Stable, Adequate PO intake, Pain level controlled, No headache, No backache, No residual numbness and No residual motor weakness  Post-op Vital Signs: Reviewed and stable  Complications: No apparent anesthesia complications

## 2013-05-28 NOTE — Lactation Note (Signed)
This note was copied from the chart of Christine Le. Lactation Consultation Note Follow up care at 46 hours of age.  Received an earlier call from Dr. Ave Filterhandler requesting mom to get help with breast feeding.  Mom has offered bottles 8 times in the past 24 hours and attempted breast twice and no latch scores noted for those attempts.  Mom reports just offering baby a bottle.  Encoaraged mom to limit bottles to smaller amounts (one bottle 35 ml) and explained why.  When asked about using DEBP mom reports she isn't getting any milk so she isn't pumping.  Discussed importance of frequent pumping/ breast stimulation to encourage milk to come in.  Discussed how to establish milk supply at length and explained how formula negatively affects that.  Mom agrees to pump more often.  Baby asleep in visitors arms.  Encouraged mom to call out for help to latch baby prior to next feeding and through the night.  Encouraged practice here at the hospital with help.  Reports discussed with [facility] RN regarding Dr's concerns.      Patient Name: Christine Marylou MccoyRina Winograd WUJWJ'XToday's Date: 05/28/2013 Reason for consult: Follow-up assessment;Difficult latch   Maternal Data    Feeding Feeding Type: Bottle Fed - Formula Nipple Type: Slow - flow  LATCH Score/Interventions                Intervention(s): Breastfeeding basics reviewed     Lactation Tools Discussed/Used     Consult Status Consult Status: Follow-up Date: 05/29/13 Follow-up type: In-patient    Shoptaw, Arvella MerlesJana Lynn 05/28/2013, 8:20 PM

## 2013-06-19 ENCOUNTER — Telehealth (HOSPITAL_COMMUNITY): Payer: Self-pay | Admitting: Lactation Services

## 2013-06-19 NOTE — Telephone Encounter (Signed)
Mother called with concerns of her milk supply decreasing. She is pumping and giving her expressed milk per bottle 6-8 times per day. Baby will latch to breast occasionally but prefers milk in a bottle. He is 453 weeks, 294 days old. Mother states her supply has decreased from 150 ml/ session to 90-15800ml / session. She is using a Lansinoh breast pump. Baby is having stead weight gain. She has questions how to increase her supply. Discussed the following: Call insurance to inquire about maternity benefit of obtaining a double electric pump for  frequency of pumping. Rental option through hospital discussed, mother pumped in hospital and has tubing. To call to set up time if she wants to get a rental. Discussed times the office is open to assist today. Increase pumping to increase milk removal and supply, baby to breast, hand expression post pumping, increase mother's po fluids, eat oatmeal, may start Fenugreek 2-3 tabs, 3 times per day. To call for further assistance as needed.

## 2014-02-22 ENCOUNTER — Encounter (HOSPITAL_COMMUNITY): Payer: Self-pay

## 2015-09-07 ENCOUNTER — Ambulatory Visit (INDEPENDENT_AMBULATORY_CARE_PROVIDER_SITE_OTHER): Payer: BLUE CROSS/BLUE SHIELD | Admitting: Medical

## 2015-09-07 ENCOUNTER — Encounter: Payer: Self-pay | Admitting: Medical

## 2015-09-07 VITALS — BP 102/64 | HR 86 | Wt 117.0 lb

## 2015-09-07 DIAGNOSIS — R7309 Other abnormal glucose: Secondary | ICD-10-CM

## 2015-09-07 DIAGNOSIS — R002 Palpitations: Secondary | ICD-10-CM | POA: Diagnosis not present

## 2015-09-07 DIAGNOSIS — R42 Dizziness and giddiness: Secondary | ICD-10-CM | POA: Diagnosis not present

## 2015-09-07 DIAGNOSIS — R739 Hyperglycemia, unspecified: Secondary | ICD-10-CM

## 2015-09-07 LAB — CBC
HEMATOCRIT: 39.3 % (ref 35.0–45.0)
HEMOGLOBIN: 12.6 g/dL (ref 11.7–15.5)
MCH: 27.6 pg (ref 27.0–33.0)
MCHC: 32.1 g/dL (ref 32.0–36.0)
MCV: 86.2 fL (ref 80.0–100.0)
MPV: 9.2 fL (ref 7.5–12.5)
PLATELETS: 253 10*3/uL (ref 140–400)
RBC: 4.56 MIL/uL (ref 3.80–5.10)
RDW: 13.2 % (ref 11.0–15.0)
WBC: 5.3 10*3/uL (ref 4.0–10.5)

## 2015-09-07 NOTE — Progress Notes (Signed)
Subjective: Chief Complaint  Patient presents with  . New Patient (Initial Visit)    was told she is borderline diabetic. said her sugar goes up and down fast since she had her child. she said her heart races fast sometimes.   Here for new patient consult.  Sees gynecology.  Was told she may be borderline diabetic.  Lately having palpitations of the heart within the last month. Has happened a few times in the past, but recently a lot.  Happens several times a week to several times a day. With palpitations, feels weak and dyspneic.  Lasts for a few minutes to up to 10 minutes.   No prior EKG ,no prior eval for this .   Drinks 2 cups of coffee daily, regular size, not much other caffeine.  Works at [employer] 7 days per week.  Owns [employer] in [place], [place].  Sleep is ok.  Gets 6 hours of sleep typically.  Had labs done last month.  Sugar was elevated.  No recent weight loss. Some times gets blurred vision. Some days she urinates a lot but does drink a lot of water.    PGM has hx/o diabetes.  Father has HTN.  No other diabetes in the family. No family hx/o heart disease   Eats a lot of vegetables, limited fruit, but over all feels she eats healthy.  Eats fried foods daily.   But not lots of quantity.     Had some elevated sugars with prior pregnancy.   Last pregnancy 2 years ago.  History reviewed. No pertinent past medical history.  ROS as in subjective  Objective: BP 102/64 mmHg  Pulse 86  Wt 117 lb (53.071 kg)  LMP 08/27/2015  Breastfeeding? No  General appearance: alert, no distress, WD/WN, lean Asian female Skin unremarkable Oral cavity: MMM, no lesions Neck: supple, no lymphadenopathy, no thyromegaly, no masses Heart: RRR, normal S1, S2, no murmurs Lungs: CTA bilaterally, no wheezes, rhonchi, or rales Ext: no edema Pulses: 2+ symmetric, upper and lower extremities, normal cap refill Neuro: CN2-12 intact, non focal exam   Adult ECG Report  Indication:  palpitations  Rate: 71 bpm  Rhythm: normal sinus rhythm  QRS Axis: 96 degrees  PR Interval: 132ms  QRS Duration: 110ms  QTc: 445ms  Conduction Disturbances: none  Other Abnormalities:incomplete RBBB, right axis deviation  Patient's cardiac risk factors are: none.  EKG comparison: none  Narrative Interpretation: incomplete RBBB, right axis deviation     Assessment: Encounter Diagnoses  Name Primary?  . Palpitations Yes  . Elevated blood sugar   . Dizziness and giddiness     Plan: Discussed symptoms, concern.   Reviewed EKG, will await labs.  Reviewed CMET from 08/11/15 that was normal, reviewed HgbA1C from 08/11/15 which was 6.3%.  discussed prediabetes, diet recommendations, exercise.   Will likely need event monitor/Holter monitor.  F/u pending labs.

## 2015-09-08 ENCOUNTER — Encounter: Payer: Self-pay | Admitting: Medical

## 2015-09-08 LAB — TSH: TSH: 1.54 mIU/L

## 2015-09-08 LAB — BASIC METABOLIC PANEL
BUN: 14 mg/dL (ref 7–25)
CHLORIDE: 102 mmol/L (ref 98–110)
CO2: 24 mmol/L (ref 20–31)
CREATININE: 0.74 mg/dL (ref 0.50–1.10)
Calcium: 8.7 mg/dL (ref 8.6–10.2)
Glucose, Bld: 97 mg/dL (ref 65–99)
POTASSIUM: 3.4 mmol/L — AB (ref 3.5–5.3)
Sodium: 137 mmol/L (ref 135–146)

## 2015-09-09 NOTE — Addendum Note (Signed)
Addended by: Kieth BrightlyLAWSON, Beyonce Sawatzky M on: 09/09/2015 08:44 AM   Modules accepted: Orders

## 2015-10-18 ENCOUNTER — Telehealth: Payer: Self-pay | Admitting: Cardiovascular Disease

## 2015-10-18 ENCOUNTER — Encounter: Payer: Self-pay | Admitting: Cardiovascular Disease

## 2015-10-18 NOTE — Telephone Encounter (Signed)
Called pt and left message for pt to call back to update Fm and Medical Hx.

## 2015-11-02 ENCOUNTER — Ambulatory Visit: Payer: BLUE CROSS/BLUE SHIELD | Admitting: Cardiovascular Disease

## 2015-11-09 ENCOUNTER — Encounter: Payer: Self-pay | Admitting: Cardiovascular Disease

## 2015-11-14 ENCOUNTER — Telehealth: Payer: Self-pay | Admitting: Medical

## 2015-11-14 NOTE — Telephone Encounter (Signed)
Have already called pt twice and left VM

## 2015-11-14 NOTE — Telephone Encounter (Signed)
Sent pt a letter.

## 2015-11-14 NOTE — Telephone Encounter (Signed)
Please check into this.  received note back that where we referred her can't get a hold of the patient

## 2015-11-14 NOTE — Telephone Encounter (Signed)
LMTCB

## 2016-07-10 ENCOUNTER — Ambulatory Visit (INDEPENDENT_AMBULATORY_CARE_PROVIDER_SITE_OTHER): Payer: BLUE CROSS/BLUE SHIELD | Admitting: Medical

## 2016-07-10 ENCOUNTER — Encounter: Payer: Self-pay | Admitting: Medical

## 2016-07-10 VITALS — BP 104/68 | HR 81 | Ht 64.0 in | Wt 120.6 lb

## 2016-07-10 DIAGNOSIS — G4489 Other headache syndrome: Secondary | ICD-10-CM

## 2016-07-10 DIAGNOSIS — Z7189 Other specified counseling: Secondary | ICD-10-CM

## 2016-07-10 DIAGNOSIS — H7292 Unspecified perforation of tympanic membrane, left ear: Secondary | ICD-10-CM

## 2016-07-10 DIAGNOSIS — Z0000 Encounter for general adult medical examination without abnormal findings: Secondary | ICD-10-CM | POA: Diagnosis not present

## 2016-07-10 DIAGNOSIS — R7301 Impaired fasting glucose: Secondary | ICD-10-CM

## 2016-07-10 DIAGNOSIS — Z7185 Encounter for immunization safety counseling: Secondary | ICD-10-CM

## 2016-07-10 LAB — CBC
HCT: 40.3 % (ref 35.0–45.0)
Hemoglobin: 13.2 g/dL (ref 11.7–15.5)
MCH: 28.2 pg (ref 27.0–33.0)
MCHC: 32.8 g/dL (ref 32.0–36.0)
MCV: 86.1 fL (ref 80.0–100.0)
MPV: 9.4 fL (ref 7.5–12.5)
Platelets: 265 10*3/uL (ref 140–400)
RBC: 4.68 MIL/uL (ref 3.80–5.10)
RDW: 13.2 % (ref 11.0–15.0)
WBC: 6.4 10*3/uL (ref 4.0–10.5)

## 2016-07-10 LAB — COMPREHENSIVE METABOLIC PANEL
ALT: 29 U/L (ref 6–29)
AST: 22 U/L (ref 10–30)
Albumin: 4.2 g/dL (ref 3.6–5.1)
Alkaline Phosphatase: 62 U/L (ref 33–115)
BUN: 16 mg/dL (ref 7–25)
CO2: 26 mmol/L (ref 20–31)
CREATININE: 0.7 mg/dL (ref 0.50–1.10)
Calcium: 9 mg/dL (ref 8.6–10.2)
Chloride: 105 mmol/L (ref 98–110)
Glucose, Bld: 93 mg/dL (ref 65–99)
Potassium: 4.4 mmol/L (ref 3.5–5.3)
SODIUM: 138 mmol/L (ref 135–146)
TOTAL PROTEIN: 7.5 g/dL (ref 6.1–8.1)
Total Bilirubin: 0.5 mg/dL (ref 0.2–1.2)

## 2016-07-10 LAB — LIPID PANEL
Cholesterol: 150 mg/dL (ref <200)
HDL: 62 mg/dL (ref >50)
LDL Cholesterol: 76 mg/dL (ref <100)
Total CHOL/HDL Ratio: 2.4 Ratio (ref <5.0)
Triglycerides: 60 mg/dL (ref <150)
VLDL: 12 mg/dL (ref <30)

## 2016-07-10 LAB — POCT URINALYSIS DIPSTICK
BILIRUBIN UA: NEGATIVE
Blood, UA: NEGATIVE
Glucose, UA: NEGATIVE
Ketones, UA: NEGATIVE
LEUKOCYTES UA: NEGATIVE
NITRITE UA: NEGATIVE
Protein, UA: NEGATIVE
Spec Grav, UA: 1.01 (ref 1.030–1.035)
Urobilinogen, UA: NEGATIVE (ref ?–2.0)
pH, UA: 6 (ref 5.0–8.0)

## 2016-07-10 LAB — TSH: TSH: 1.4 mIU/L

## 2016-07-10 NOTE — Progress Notes (Signed)
Subjective:   HPI  Christine Le is a 36 y.o. female who presents for physical Chief Complaint  Patient presents with  . Annual Exam    physical ,headaches, still having the fast heart beat once a month not like before     Medical care team includes: Ernst BreachYSINGER, Alexandra Posadas SHANE, PA-C here for primary care Dentist Eye doctor Dr. 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   Skips breakfast sometimes.   Drinks 1 cup of coffee daily , not other caffeine.   Sleep not always great as she has a toddler.  Reviewed their medical, surgical, family, social, medication, and allergy history and updated chart as appropriate.  History reviewed. No pertinent past medical history.  Past Surgical History:  Procedure Laterality Date  . CESAREAN SECTION N/A 05/26/2013   Procedure: Primary Cesarean Section Delivery Baby Boy @ 2129, Apgar 8/9;  Surgeon: Loney LaurenceMichelle A Horvath, MD;  Location: [facility];  Service: Obstetrics;  Laterality: N/A;    Social History   Social History  . Marital status: Married    Spouse name: N/A  . Number of children: N/A  . Years of education: N/A   Occupational History  . Not on file.   Social History Main Topics  . Smoking status: Never Smoker  . Smokeless tobacco: Never Used  . Alcohol use 1.8 oz/week    3 Glasses of wine per week  . Drug use: No  . Sexual activity: Yes   Other Topics Concern  . Not on file   Social History Narrative   Married.  Has 3yo son.  Walks a lot.   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    Family History  Problem Relation Age of Onset  . Rheumatologic disease Mother   . Hypertension Father   . Stroke Maternal Grandfather   . Down syndrome Brother   . Cancer Neg Hx   . Heart disease Neg Hx   . Depression Neg Hx      Current Outpatient Prescriptions:  .  FOLIC ACID PO, Take by mouth. 800mg  once a day, Disp: , Rfl:  .  amoxicillin  (AMOXIL) 500 MG tablet, 2 tablets po BID x 10 days, Disp: 40 tablet, Rfl: 0 .  Cholecalciferol (VITAMIN D) 2000 units CAPS, Take 1 capsule (2,000 Units total) by mouth daily., Disp: 30 capsule, Rfl: 11  No Known Allergies   Review of Systems Constitutional: -fever, -chills, -sweats, -unexpected weight change, -decreased appetite, -fatigue Allergy: -sneezing, -itching, -congestion Dermatology: -changing moles, --rash, -lumps ENT: -runny nose, -ear pain, -sore throat, -hoarseness, -sinus pain, -teeth pain, - ringing in ears, -hearing loss, -nosebleeds Cardiology: -chest pain, -palpitations, -swelling, -difficulty breathing when lying flat, -waking up short of breath Respiratory: -cough, -shortness of breath, -difficulty breathing with exercise or exertion, -wheezing, -coughing up blood Gastroenterology: -abdominal pain, -nausea, -vomiting, -diarrhea, -constipation, -blood in stool, -changes in bowel movement, -difficulty swallowing or eating Hematology: -bleeding, -bruising  Musculoskeletal: -joint aches, -muscle aches, -joint swelling, -back pain, -neck pain, -cramping, -changes in gait Ophthalmology: denies vision changes, eye redness, itching, discharge Urology: -burning with urination, -difficulty urinating, -blood in urine, -urinary frequency, -urgency, -incontinence Neurology: +headache, -weakness, -tingling, -numbness, -memory loss, -falls, -dizziness Psychology: -depressed mood, -agitation, -sleep problems Breast/gyn: -breast tenderness, -discharge, -lumps, -vaginal discharge,- irregular periods, -heavy periods      Objective:   BP 104/68   Pulse 81   Ht 5\' 4"  (1.626 m)  Wt 120 lb 9.6 oz (54.7 kg)   SpO2 99%   BMI 20.70 kg/m   General appearance: alert, no distress, WD/WN, Asian female Skin: unremarkable HEENT: normocephalic, conjunctiva/corneas normal, sclerae anicteric, PERRLA, EOMi, left TM with small perforation, nares patent, no discharge or erythema, pharynx  normal Oral cavity: MMM, tongue normal, teeth normal Neck: supple, no lymphadenopathy, no thyromegaly, no masses, normal ROM, no bruits Chest: non tender, normal shape and expansion Heart: RRR, normal S1, S2, no murmurs Lungs: CTA bilaterally, no wheezes, rhonchi, or rales Abdomen: +bs, soft, non tender, non distended, no masses, no hepatomegaly, no splenomegaly, no bruits Back: non tender, normal ROM, no scoliosis Musculoskeletal: upper extremities non tender, no obvious deformity, normal ROM throughout, lower extremities non tender, no obvious deformity, normal ROM throughout Extremities: no edema, no cyanosis, no clubbing Pulses: 2+ symmetric, upper and lower extremities, normal cap refill Neurological: alert, oriented x 3, CN2-12 intact, strength normal upper extremities and lower extremities, sensation normal throughout, DTRs 2+ throughout, no cerebellar signs, gait normal Psychiatric: normal affect, behavior normal, pleasant  Breast/gyn/rectal - deferred to gynecology   Assessment and Plan :    Encounter Diagnoses  Name Primary?  . Routine general medical examination at a health care facility Yes  . Impaired fasting blood sugar   . Perforation of left tympanic membrane   . Vaccine counseling   . Headache syndrome     Physical exam - discussed and counseled on healthy lifestyle, diet, exercise, preventative care, vaccinations, sick and well care, proper use of emergency dept and after hours care, and addressed their concerns.    Cancer screening Discussed and advised monthly self breast exams Discussed mammogram, advised mammogram age 74yo, sooner if lumps/mass Discussed pap smear recommendations.   Pap smear up to date per her report through gynecology Discussed colonoscopy screening age 41yo unless higher risk for earlier screening Discussed other symptoms/signs that would suggest need to do other evaluation, such as weight loss, skin lesion changes, fever, night sweats, or  other.  Vaccinations: Counseled on the following vaccines:  influenza  Acute issues discussed: Discussed findings of perforated left TM.  Begin antibiotic, f/u 2wk. headaches - don't skip meals, work on stress reduction, sleep hygeine. f/u prn  Separate significant chronic issues discussed: Recheck on impaired fasting glucose  Dellanira was seen today for annual exam.  Diagnoses and all orders for this visit:  Routine general medical examination at a health care facility -     Urinalysis Dipstick -     CBC -     Comprehensive metabolic panel -     Lipid panel -     TSH -     Hemoglobin A1c -     VITAMIN D 25 Hydroxy (Vit-D Deficiency, Fractures)  Impaired fasting blood sugar -     Hemoglobin A1c  Perforation of left tympanic membrane  Vaccine counseling  Headache syndrome    Follow-up pending labs, yearly for physical

## 2016-07-11 ENCOUNTER — Other Ambulatory Visit: Payer: Self-pay | Admitting: Medical

## 2016-07-11 LAB — HEMOGLOBIN A1C
HEMOGLOBIN A1C: 5.6 % (ref <5.7)
MEAN PLASMA GLUCOSE: 114 mg/dL

## 2016-07-11 LAB — VITAMIN D 25 HYDROXY (VIT D DEFICIENCY, FRACTURES): Vit D, 25-Hydroxy: 29 ng/mL — ABNORMAL LOW (ref 30–100)

## 2016-07-11 MED ORDER — AMOXICILLIN 500 MG PO TABS: ORAL_TABLET | ORAL | 0 refills | Status: DC

## 2016-07-11 MED ORDER — VITAMIN D 50 MCG (2000 UT) PO CAPS
1.0000 | ORAL_CAPSULE | Freq: Every day | ORAL | 11 refills | Status: AC
Start: 2016-07-11 — End: ?

## 2016-07-12 DIAGNOSIS — R7301 Impaired fasting glucose: Secondary | ICD-10-CM

## 2016-07-12 DIAGNOSIS — Z7185 Encounter for immunization safety counseling: Secondary | ICD-10-CM | POA: Insufficient documentation

## 2016-07-12 DIAGNOSIS — G4489 Other headache syndrome: Secondary | ICD-10-CM | POA: Insufficient documentation

## 2016-07-12 DIAGNOSIS — H7292 Unspecified perforation of tympanic membrane, left ear: Secondary | ICD-10-CM | POA: Insufficient documentation

## 2016-07-12 DIAGNOSIS — Z0000 Encounter for general adult medical examination without abnormal findings: Secondary | ICD-10-CM | POA: Insufficient documentation

## 2016-07-12 DIAGNOSIS — Z7189 Other specified counseling: Secondary | ICD-10-CM | POA: Insufficient documentation

## 2016-07-12 HISTORY — DX: Impaired fasting glucose: R73.01

## 2016-07-12 HISTORY — DX: Unspecified perforation of tympanic membrane, left ear: H72.92

## 2018-05-09 ENCOUNTER — Encounter: Payer: BLUE CROSS/BLUE SHIELD | Admitting: Medical

## 2018-05-12 ENCOUNTER — Encounter: Payer: BLUE CROSS/BLUE SHIELD | Admitting: Medical

## 2018-05-20 ENCOUNTER — Encounter: Payer: Self-pay | Admitting: Medical

## 2018-07-14 ENCOUNTER — Inpatient Hospital Stay (HOSPITAL_COMMUNITY)
Admission: AD | Admit: 2018-07-14 | Discharge: 2018-07-14 | Disposition: A | Discharge status: Home / Self Care | Payer: BLUE CROSS/BLUE SHIELD | Attending: Obstetrics and Gynecology | Admitting: Obstetrics and Gynecology

## 2018-07-14 ENCOUNTER — Encounter (HOSPITAL_COMMUNITY): Payer: Self-pay

## 2018-07-14 ENCOUNTER — Other Ambulatory Visit: Payer: Self-pay

## 2018-07-14 ENCOUNTER — Inpatient Hospital Stay (HOSPITAL_COMMUNITY): Payer: BLUE CROSS/BLUE SHIELD

## 2018-07-14 DIAGNOSIS — Z3491 Encounter for supervision of normal pregnancy, unspecified, first trimester: Secondary | ICD-10-CM

## 2018-07-14 DIAGNOSIS — O4691 Antepartum hemorrhage, unspecified, first trimester: Secondary | ICD-10-CM | POA: Diagnosis not present

## 2018-07-14 DIAGNOSIS — Z8279 Family history of other congenital malformations, deformations and chromosomal abnormalities: Secondary | ICD-10-CM | POA: Diagnosis not present

## 2018-07-14 DIAGNOSIS — O3680X0 Pregnancy with inconclusive fetal viability, not applicable or unspecified: Secondary | ICD-10-CM | POA: Insufficient documentation

## 2018-07-14 DIAGNOSIS — Z3A08 8 weeks gestation of pregnancy: Secondary | ICD-10-CM | POA: Insufficient documentation

## 2018-07-14 DIAGNOSIS — O4690 Antepartum hemorrhage, unspecified, unspecified trimester: Secondary | ICD-10-CM

## 2018-07-14 DIAGNOSIS — O34219 Maternal care for unspecified type scar from previous cesarean delivery: Secondary | ICD-10-CM | POA: Insufficient documentation

## 2018-07-14 DIAGNOSIS — Z79899 Other long term (current) drug therapy: Secondary | ICD-10-CM | POA: Insufficient documentation

## 2018-07-14 DIAGNOSIS — O200 Threatened abortion: Secondary | ICD-10-CM

## 2018-07-14 LAB — WET PREP, GENITAL
Clue Cells Wet Prep HPF POC: NONE SEEN
Sperm: NONE SEEN
Trich, Wet Prep: NONE SEEN
Yeast Wet Prep HPF POC: NONE SEEN

## 2018-07-14 LAB — CBC
HCT: 39 % (ref 36.0–46.0)
Hemoglobin: 12.8 g/dL (ref 12.0–15.0)
MCH: 28.1 pg (ref 26.0–34.0)
MCHC: 32.8 g/dL (ref 30.0–36.0)
MCV: 85.5 fL (ref 80.0–100.0)
Platelets: 271 10*3/uL (ref 150–400)
RBC: 4.56 MIL/uL (ref 3.87–5.11)
RDW: 12.5 % (ref 11.5–15.5)
WBC: 5.8 10*3/uL (ref 4.0–10.5)
nRBC: 0 % (ref 0.0–0.2)

## 2018-07-14 LAB — URINALYSIS, ROUTINE W REFLEX MICROSCOPIC
Bacteria, UA: NONE SEEN
Bilirubin Urine: NEGATIVE
Glucose, UA: NEGATIVE mg/dL
Ketones, ur: NEGATIVE mg/dL
Leukocytes,Ua: NEGATIVE
Nitrite: NEGATIVE
Protein, ur: NEGATIVE mg/dL
Specific Gravity, Urine: 1.005 (ref 1.005–1.030)
pH: 8 (ref 5.0–8.0)

## 2018-07-14 LAB — POCT PREGNANCY, URINE: Preg Test, Ur: POSITIVE — AB

## 2018-07-14 LAB — HCG, QUANTITATIVE, PREGNANCY: hCG, Beta Chain, Quant, S: 3859 m[IU]/mL — ABNORMAL HIGH (ref <5)

## 2018-07-14 NOTE — MAU Note (Signed)
Pt reports bright red vaginal bleeding for several days but got heavier today. Pt reports today she started having some abdominal pain. LMP: 05/16/2018. Pt reports she plans to get [facility] at [facility]

## 2018-07-14 NOTE — MAU Provider Note (Signed)
History     CSN: [id number]  Arrival date and time: 07/14/18 2037   First Provider Initiated Contact with Patient 07/14/18 2131      Chief Complaint  Patient presents with  . Vaginal Bleeding   Christine Le is a 38 y.o. G3P1 at [redacted]w[redacted]d by LMP who presents to MAU with complaints of vaginal bleeding. She reports vaginal bleeding started occurring 2 days ago, initially started as dark red spotting that was intermittent. Since this morning vaginal bleeding has increased became bright red and constant. Reports having to wear a panty liner but not heavy enough for a pad. Reports abdominal cramping is associated with vaginal bleeding. Abdominal pain started occurring this afternoon, reports cramping is specific to RLQ. Rates pain 4/10- has not taken any medication for abdominal pain. She denies being seen anywhere during this pregnancy. Denies recent IC.    OB History    Gravida  3   Para  1   Term  1   Preterm      AB      Living  1     SAB      TAB      Ectopic      Multiple      Live Births  1           No past medical history on file.  Past Surgical History:  Procedure Laterality Date  . CESAREAN SECTION N/A 05/26/2013   Procedure: Primary Cesarean Section Delivery Baby Boy @ 2129, Apgar 8/9;  Surgeon: Loney Laurence, MD;  Location: [facility];  Service: Obstetrics;  Laterality: N/A;    Family History  Problem Relation Age of Onset  . Rheumatologic disease Mother   . Hypertension Father   . Stroke Maternal Grandfather   . Down syndrome Brother   . Cancer Neg Hx   . Heart disease Neg Hx   . Depression Neg Hx     Social History   Tobacco Use  . Smoking status: Never Smoker  . Smokeless tobacco: Never Used  Substance Use Topics  . Alcohol use: Yes    Alcohol/week: 3.0 standard drinks    Types: 3 Glasses of wine per week  . Drug use: No    Allergies: No Known Allergies  Medications Prior to Admission  Medication Sig Dispense Refill Last Dose  .  calcium-vitamin D (OSCAL WITH D) 500-200 MG-UNIT tablet Take 1 tablet by mouth.     . Prenatal Vit-Fe Fumarate-FA (MULTIVITAMIN-PRENATAL) 27-0.8 MG TABS tablet Take 1 tablet by mouth daily at 12 noon.     Marland Kitchen amoxicillin (AMOXIL) 500 MG tablet 2 tablets po BID x 10 days 40 tablet 0  at not taking  . Cholecalciferol (VITAMIN D) 2000 units CAPS Take 1 capsule (2,000 Units total) by mouth daily. 30 capsule 11   . FOLIC ACID PO Take by mouth. 800mg  once a day   Taking    Review of Systems  Constitutional: Negative.   Respiratory: Negative.   Cardiovascular: Negative.   Gastrointestinal: Positive for abdominal pain. Negative for constipation, diarrhea, nausea and vomiting.  Genitourinary: Positive for vaginal bleeding. Negative for difficulty urinating, dysuria, flank pain, frequency, pelvic pain and urgency.  Musculoskeletal: Negative.    Physical Exam   Blood pressure 111/74, pulse 74, temperature 98.9 F (37.2 C), temperature source Oral, resp. rate 16, height 5\' 5"  (1.651 m), weight 57.4 kg, last menstrual period 05/16/2018, SpO2 99 %.  Physical Exam  Nursing note and vitals reviewed. Constitutional: She is  oriented to person, place, and time. She appears well-developed and well-nourished. No distress.  Cardiovascular: Normal rate, regular rhythm and normal heart sounds.  No murmur heard. Respiratory: Effort normal and breath sounds normal. No respiratory distress. She has no wheezes. She has no rales.  GI: Soft. She exhibits no distension. There is abdominal tenderness. There is no rebound and no guarding.  Tenderness with palpation in RLQ, negative rebound  Genitourinary:    Genitourinary Comments: Pelvic exam: Cervix pink, visually closed, without lesion, small amount of bright red vaginal bleeding- small clot removed with 1 faux swab, vaginal walls and external genitalia normal Bimanual exam: Cervix 0 internally (0.5 externally)/long/high, firm, anterior, neg CMT, uterus nontender,  nonenlarged, adnexa without tenderness, enlargement, or mass   Musculoskeletal: Normal range of motion.        General: No edema.  Neurological: She is alert and oriented to person, place, and time.    MAU Course  Procedures  MDM Orders Placed This Encounter  Procedures  . Wet prep, genital  . US OB LESS THAN 14 WEEKS WITH OB TRANSVAGINAL  . Urinalysis, Routine w reflex microscopic  . CBC  . hCG, quantitative, pregnancy  . HIV Antibody (routine testing w rflx)  . Pregnancy, urine POC   Labs results and [place] report reviewed:  Results for orders placed or performed during the hospital encounter of 07/14/18 (from the past 24 hour(s))  Pregnancy, urine POC     Status: Abnormal   Collection Time: 07/14/18  8:58 PM  Result Value Ref Range   Preg Test, Ur POSITIVE (A) NEGATIVE  Urinalysis, Routine w reflex microscopic     Status: Abnormal   Collection Time: 07/14/18  9:00 PM  Result Value Ref Range   Color, Urine COLORLESS (A) YELLOW   APPearance CLEAR CLEAR   Specific Gravity, Urine 1.005 1.005 - 1.030   pH 8.0 5.0 - 8.0   Glucose, UA NEGATIVE NEGATIVE mg/dL   Hgb urine dipstick SMALL (A) NEGATIVE   Bilirubin Urine NEGATIVE NEGATIVE   Ketones, ur NEGATIVE NEGATIVE mg/dL   Protein, ur NEGATIVE NEGATIVE mg/dL   Nitrite NEGATIVE NEGATIVE   Leukocytes,Ua NEGATIVE NEGATIVE   RBC / HPF 0-5 0 - 5 RBC/hpf   WBC, UA 0-5 0 - 5 WBC/hpf   Bacteria, UA NONE SEEN NONE SEEN  CBC     Status: None   Collection Time: 07/14/18  9:14 PM  Result Value Ref Range   WBC 5.8 4.0 - 10.5 K/uL   RBC 4.56 3.87 - 5.11 MIL/uL   Hemoglobin 12.8 12.0 - 15.0 g/dL   HCT 53.6 64.4 - 03.4 %   MCV 85.5 80.0 - 100.0 fL   MCH 28.1 26.0 - 34.0 pg   MCHC 32.8 30.0 - 36.0 g/dL   RDW 74.2 59.5 - 63.8 %   Platelets 271 150 - 400 K/uL   nRBC 0.0 0.0 - 0.2 %  hCG, quantitative, pregnancy     Status: Abnormal   Collection Time: 07/14/18  9:14 PM  Result Value Ref Range   hCG, Beta Chain, Quant, S 3,859 (H) <5  mIU/mL  Wet prep, genital     Status: Abnormal   Collection Time: 07/14/18  9:45 PM  Result Value Ref Range   Yeast Wet Prep HPF POC NONE SEEN NONE SEEN   Trich, Wet Prep NONE SEEN NONE SEEN   Clue Cells Wet Prep HPF POC NONE SEEN NONE SEEN   WBC, Wet Prep HPF POC MANY (A) NONE SEEN  Sperm NONE SEEN    [place] Ob Less Than 14 Weeks With Ob Transvaginal  Result Date: 07/14/2018 CLINICAL DATA:  Pregnant patient in first-trimester pregnancy with vaginal bleeding. EXAM: OBSTETRIC <14 WK US AND TRANSVAGINAL OB US TECHNIQUE: Both transabdominal and transvaginal ultrasound examinations were performed for complete evaluation of the gestation as well as the maternal uterus, adnexal regions, and pelvic cul-de-sac. Transvaginal technique was performed to assess early pregnancy. COMPARISON:  None. FINDINGS: Intrauterine gestational sac: Single Yolk sac:  Visualized. Embryo:  Not Visualized. Cardiac Activity: Not Visualized. MSD: 8.8 mm   5 w   4 d Subchorionic hemorrhage:  None visualized. Maternal uterus/adnexae: There is a 1.9 cm corpus luteal cyst in the left ovary. There is a 6.0 x 3.3 x 4.3 cm simple cyst in the right ovary. Trace free fluid in the pelvis. IMPRESSION: 1. Early intrauterine just sec with a yolk sac but no fetal pole or cardiac activity yet visualized. Recommend follow-up quantitative B-HCG levels and follow-up US in 10-14 days to assess viability. This recommendation follows SRU consensus guidelines: Diagnostic Criteria for Nonviable Pregnancy Early in the First Trimester. Malva Limes Engl J Med 2013; 161:0960-45; 369:1443-51. 2. Simple right ovarian cyst measuring 6 cm. This is likely incidental in the absence of pelvic pain Electronically Signed   By: Narda RutherfordMelanie  Sanford M.D.   On: 07/14/2018 22:24   Discussed results of labs and US with patient. Educated on follow up with viability US in 2 weeks. Reviewed threatened miscarriage precautions due to vaginal bleeding. Encouraged to scheduled initial prenatal appointment. Pt  stable at time of discharge.   Assessment and Plan   1. Threatened miscarriage in early pregnancy   2. Vaginal bleeding during pregnancy   3. Normal IUP (intrauterine pregnancy) on prenatal ultrasound, first trimester   4. Pregnancy with uncertain fetal viability, single or unspecified fetus    Discharge home Make initial prenatal appointment  Threatened miscarriage precautions  Pelvic rest  Follow up US for viability   Follow-up Information    [facility] Schedule an appointment as soon as possible for a visit in 2 week(s).   Specialty:  Obstetrics and Gynecology Why:  Make appointment to be seen for prenatal appointments  Contact information: [address] [place] [id number] [phone]         Allergies as of 07/14/2018   No Known Allergies     Medication List    STOP taking these medications   amoxicillin 500 MG tablet Commonly known as:  AMOXIL   calcium-vitamin D 500-200 MG-UNIT tablet Commonly known as:  OSCAL WITH D     TAKE these medications   FOLIC ACID PO Take by mouth. 800mg  once a day   multivitamin-prenatal 27-0.8 MG Tabs tablet Take 1 tablet by mouth daily at 12 noon.   Vitamin D 50 MCG (2000 UT) Caps Take 1 capsule (2,000 Units total) by mouth daily.       Sharyon CableVeronica C Nikole Swartzentruber CNM 07/14/2018, 11:16 PM

## 2018-07-15 ENCOUNTER — Other Ambulatory Visit: Payer: Self-pay

## 2018-07-15 ENCOUNTER — Encounter (HOSPITAL_COMMUNITY): Payer: Self-pay | Admitting: *Deleted

## 2018-07-15 ENCOUNTER — Inpatient Hospital Stay (HOSPITAL_COMMUNITY)
Admission: AD | Admit: 2018-07-15 | Discharge: 2018-07-15 | Disposition: A | Discharge status: Home / Self Care | Payer: BLUE CROSS/BLUE SHIELD | Attending: Family Medicine | Admitting: Family Medicine

## 2018-07-15 ENCOUNTER — Inpatient Hospital Stay (HOSPITAL_COMMUNITY): Payer: BLUE CROSS/BLUE SHIELD

## 2018-07-15 DIAGNOSIS — O34219 Maternal care for unspecified type scar from previous cesarean delivery: Secondary | ICD-10-CM | POA: Diagnosis not present

## 2018-07-15 DIAGNOSIS — Z3A08 8 weeks gestation of pregnancy: Secondary | ICD-10-CM | POA: Diagnosis not present

## 2018-07-15 DIAGNOSIS — O200 Threatened abortion: Secondary | ICD-10-CM | POA: Insufficient documentation

## 2018-07-15 DIAGNOSIS — O209 Hemorrhage in early pregnancy, unspecified: Secondary | ICD-10-CM

## 2018-07-15 DIAGNOSIS — Z79899 Other long term (current) drug therapy: Secondary | ICD-10-CM | POA: Diagnosis not present

## 2018-07-15 LAB — HIV ANTIBODY (ROUTINE TESTING W REFLEX): HIV Screen 4th Generation wRfx: NONREACTIVE

## 2018-07-15 NOTE — Discharge Instructions (Signed)
Threatened Miscarriage  A threatened miscarriage occurs when a woman has vaginal bleeding during the first 20 weeks of pregnancy but the pregnancy has not ended. If you have vaginal bleeding during this time, your health care provider will do tests to make sure you are still pregnant. If the tests show that you are still pregnant and that the developing baby (fetus) inside your uterus is still growing, your condition is considered a threatened miscarriage. A threatened miscarriage does not mean your pregnancy will end, but it does increase the risk of losing your pregnancy (complete miscarriage). What are the causes? The cause of this condition is usually not known. For women who go on to have a complete miscarriage, the most common cause is an abnormal number of chromosomes in the developing baby. Chromosomes are the structures inside cells that hold all of a person's genetic material. What increases the risk? The following lifestyle factors may increase your risk of a miscarriage in early pregnancy:  Smoking.  Drinking excessive amounts of alcohol or caffeine.  Recreational drug use. The following preexisting health conditions may increase your risk of a miscarriage in early pregnancy:  Polycystic ovary syndrome.  Uterine fibroids.  Infections.  Diabetes mellitus. What are the signs or symptoms? Symptoms of this condition include:  Vaginal bleeding.  Mild abdominal pain or cramps. How is this diagnosed? If you have bleeding with or without abdominal pain before 20 weeks of pregnancy, your health care provider will do tests to check whether you are still pregnant. These will include:  Ultrasound. This test uses sound waves to create images of the inside of your uterus. This allows your health care provider to look at your developing baby and other structures, such as your placenta.  Pelvic exam. This is an internal exam of your vagina and cervix.  Measurement of your baby's heart  rate.  Laboratory tests such as blood tests, urine tests, or swabs for infection You may be diagnosed with a threatened miscarriage if:  Ultrasound testing shows that you are still pregnant.  Your baby's heart rate is strong.  A pelvic exam shows that the opening between your uterus and your vagina (cervix) is closed.  Blood tests confirm that you are still pregnant. How is this treated? No treatments have been shown to prevent a threatened miscarriage from going on to a complete miscarriage. However, the right home care is important. Follow these instructions at home:  Get plenty of rest.  Do not have sex or use tampons if you have vaginal bleeding.  Do not douche.  Do not smoke or use recreational drugs.  Do not drink alcohol.  Avoid caffeine.  Keep all follow-up prenatal visits as told by your health care provider. This is important. Contact a health care provider if:  You have light vaginal bleeding or spotting while pregnant.  You have abdominal pain or cramping.  You have a fever. Get help right away if:  You have heavy vaginal bleeding.  You have blood clots coming from your vagina.  You pass tissue from your vagina.  You leak fluid, or you have a gush of fluid from your vagina.  You have severe low back pain or abdominal cramps.  You have fever, chills, and severe abdominal pain. Summary  A threatened miscarriage occurs when a woman has vaginal bleeding during the first 20 weeks of pregnancy but the pregnancy has not ended.  The cause of a threatened miscarriage is usually not known.  Symptoms of this condition may   include vaginal bleeding and mild abdominal pain or cramps.  No treatments have been shown to prevent a threatened miscarriage from going on to a complete miscarriage.  Keep all follow-up prenatal visits as told by your health care provider. This is important. This information is not intended to replace advice given to you by your health  care provider. Make sure you discuss any questions you have with your health care provider. Document Released: 04/09/2005 Document Revised: 07/06/2016 Document Reviewed: 07/06/2016 Elsevier Interactive Patient Education  2019 [employer]  

## 2018-07-15 NOTE — MAU Provider Note (Signed)
Chief Complaint: Abdominal Pain and Vaginal Bleeding   First Provider Initiated Contact with Patient 07/15/18 1822     SUBJECTIVE HPI: Christine Le is a 38 y.o. G2P1001 at [redacted]w[redacted]d who presents to [facility] reporting vaginal bleeding & abdominal pain. Was seen in MAU last night for same & had ultrasound that showed IUGS w/YS. Reports bleeding & pain worsened last night. Saturates a pad every 3 hours & has been passing small clots. States when she sits on the toilet the blood just runs out of her into the toilet. Last night the lower abdominal pain was worse but improved with tylenol.   Location: abdomen Quality: cramping Severity: 6/10 on pain scale Duration: 1 day Timing: intermittent Modifying factors: none Associated signs and symptoms: vaginal bleeding  History reviewed. No pertinent past medical history. OB History  Gravida Para Term Preterm AB Living  2 1 1     1   SAB TAB Ectopic Multiple Live Births          1    # Outcome Date GA Lbr Len/2nd Weight Sex Delivery Anes PTL Lv  2 Current           1 Term 05/26/13 [redacted]w[redacted]d  3645 g M CS-LTranv EPI  LIV   Past Surgical History:  Procedure Laterality Date  . CESAREAN SECTION N/A 05/26/2013   Procedure: Primary Cesarean Section Delivery Baby Boy @ 2129, Apgar 8/9;  Surgeon: Loney Laurence, MD;  Location: [facility];  Service: Obstetrics;  Laterality: N/A;   Social History   Socioeconomic History  . Marital status: Married    Spouse name: Not on file  . Number of children: Not on file  . Years of education: Not on file  . Highest education level: Not on file  Occupational History  . Not on file  Social Needs  . Financial resource strain: Not on file  . Food insecurity:    Worry: Not on file    Inability: Not on file  . Transportation needs:    Medical: Not on file    Non-medical: Not on file  Tobacco Use  . Smoking status: Never Smoker  . Smokeless tobacco: Never Used  Substance and Sexual Activity  . Alcohol use:  Not Currently    Alcohol/week: 3.0 standard drinks    Types: 3 Glasses of wine per week  . Drug use: No  . Sexual activity: Yes  Lifestyle  . Physical activity:    Days per week: Not on file    Minutes per session: Not on file  . Stress: Not on file  Relationships  . Social connections:    Talks on phone: Not on file    Gets together: Not on file    Attends religious service: Not on file    Active member of club or organization: Not on file    Attends meetings of clubs or organizations: Not on file    Relationship status: Not on file  . Intimate partner violence:    Fear of current or ex partner: Not on file    Emotionally abused: Not on file    Physically abused: Not on file    Forced sexual activity: Not on file  Other Topics Concern  . Not on file  Social History Narrative   Married.  Has 3yo son.  Walks a lot.   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  06/2016.   Family History  Problem Relation Age of Onset  . Rheumatologic disease Mother   .  Hypertension Father   . Stroke Maternal Grandfather   . Down syndrome Brother   . Cancer Neg Hx   . Heart disease Neg Hx   . Depression Neg Hx    No current facility-administered medications on file prior to encounter.    Current Outpatient Medications on File Prior to Encounter  Medication Sig Dispense Refill  . Cholecalciferol (VITAMIN D) 2000 units CAPS Take 1 capsule (2,000 Units total) by mouth daily. 30 capsule 11  . FOLIC ACID PO Take by mouth. 800mg  once a day    . Prenatal Vit-Fe Fumarate-FA (MULTIVITAMIN-PRENATAL) 27-0.8 MG TABS tablet Take 1 tablet by mouth daily at 12 noon.     No Known Allergies  I have reviewed patient's Past Medical Hx, Surgical Hx, Family Hx, Social Hx, medications and allergies.   Review of Systems  Constitutional: Negative.   Gastrointestinal: Positive for abdominal pain.  Genitourinary: Positive for vaginal bleeding.    OBJECTIVE Patient Vitals for the past 24 hrs:   BP Temp Temp src Pulse Resp SpO2 Weight  07/15/18 1812 112/68 - - 77 16 - -  07/15/18 1753 121/73 98.9 F (37.2 C) Oral 76 17 100 % -  07/15/18 1749 - - - - - - 57.1 kg   Constitutional: Well-developed, well-nourished female in no acute distress.  Cardiovascular: normal rate & rhythm, no murmur Respiratory: normal rate and effort. Lung sounds clear throughout GI: Abd soft, non-tender, Pos BS x 4. No guarding or rebound tenderness MS: Extremities nontender, no edema, normal ROM Neurologic: Alert and oriented x 4.  GU:     SPECULUM EXAM: NEFG, small amount of dark red blood cleared out with 2 fox swabs    LAB RESULTS Results for orders placed or performed during the hospital encounter of 07/14/18 (from the past 24 hour(s))  Pregnancy, urine POC     Status: Abnormal   Collection Time: 07/14/18  8:58 PM  Result Value Ref Range   Preg Test, Ur POSITIVE (A) NEGATIVE  Urinalysis, Routine w reflex microscopic     Status: Abnormal   Collection Time: 07/14/18  9:00 PM  Result Value Ref Range   Color, Urine COLORLESS (A) YELLOW   APPearance CLEAR CLEAR   Specific Gravity, Urine 1.005 1.005 - 1.030   pH 8.0 5.0 - 8.0   Glucose, UA NEGATIVE NEGATIVE mg/dL   Hgb urine dipstick SMALL (A) NEGATIVE   Bilirubin Urine NEGATIVE NEGATIVE   Ketones, ur NEGATIVE NEGATIVE mg/dL   Protein, ur NEGATIVE NEGATIVE mg/dL   Nitrite NEGATIVE NEGATIVE   Leukocytes,Ua NEGATIVE NEGATIVE   RBC / HPF 0-5 0 - 5 RBC/hpf   WBC, UA 0-5 0 - 5 WBC/hpf   Bacteria, UA NONE SEEN NONE SEEN  CBC     Status: None   Collection Time: 07/14/18  9:14 PM  Result Value Ref Range   WBC 5.8 4.0 - 10.5 K/uL   RBC 4.56 3.87 - 5.11 MIL/uL   Hemoglobin 12.8 12.0 - 15.0 g/dL   HCT 46.8 03.2 - 12.2 %   MCV 85.5 80.0 - 100.0 fL   MCH 28.1 26.0 - 34.0 pg   MCHC 32.8 30.0 - 36.0 g/dL   RDW 48.2 50.0 - 37.0 %   Platelets 271 150 - 400 K/uL   nRBC 0.0 0.0 - 0.2 %  hCG, quantitative, pregnancy     Status: Abnormal   Collection Time:  07/14/18  9:14 PM  Result Value Ref Range   hCG, Beta Chain, Quant, S 3,859 (  H) <5 mIU/mL  HIV Antibody (routine testing w rflx)     Status: None   Collection Time: 07/14/18  9:14 PM  Result Value Ref Range   HIV Screen 4th Generation wRfx Non Reactive Non Reactive  Wet prep, genital     Status: Abnormal   Collection Time: 07/14/18  9:45 PM  Result Value Ref Range   Yeast Wet Prep HPF POC NONE SEEN NONE SEEN   Trich, Wet Prep NONE SEEN NONE SEEN   Clue Cells Wet Prep HPF POC NONE SEEN NONE SEEN   WBC, Wet Prep HPF POC MANY (A) NONE SEEN   Sperm NONE SEEN     IMAGING US Ob Transvaginal  Result Date: 07/15/2018 CLINICAL DATA:  Bleeding since prior exam. Positive urine pregnancy test. EXAM: TRANSVAGINAL OB ULTRASOUND TECHNIQUE: Transvaginal ultrasound was performed for complete evaluation of the gestation as well as the maternal uterus, adnexal regions, and pelvic cul-de-sac. COMPARISON:  07/14/2018 FINDINGS: Intrauterine gestational sac: Single Yolk sac:  Visualized. Embryo:  Not Visualized. Cardiac Activity: Not Visualized. Heart Rate: Not applicable MSD: 8.09 mm   5 w   for d Subchorionic hemorrhage: None Maternal uterus/adnexae: Anechoic right ovarian simple cyst measuring 5.9 x 3.4 x 4.8 cm, without significant change since 1 day prior. Normal left ovary. Free fluid: None IMPRESSION: 1. No findings for the patient's vaginal bleeding. Redemonstration of early intrauterine gestational sac with yolk sac but no fetal pole or cardiac activity and visualized.Recommend follow-up quantitative B-HCG levels and follow-up US in 10-14 days to assess viability. This recommendation follows SRU consensus guidelines: Diagnostic criteria for Nonviable Pregnancy Early in the First Trimester. Malva Limes Med 2013; 045:4098-11. 2. Stable simple cyst of the right ovary currently estimated at 5.9 x 3 4 x 4.8 cm. Electronically Signed   By: Tollie Eth M.D.   On: 07/15/2018 20:12   US Ob Less Than 14 Weeks With Ob  Transvaginal  Result Date: 07/14/2018 CLINICAL DATA:  Pregnant patient in first-trimester pregnancy with vaginal bleeding. EXAM: OBSTETRIC <14 WK [place] AND TRANSVAGINAL OB US TECHNIQUE: Both transabdominal and transvaginal ultrasound examinations were performed for complete evaluation of the gestation as well as the maternal uterus, adnexal regions, and pelvic cul-de-sac. Transvaginal technique was performed to assess early pregnancy. COMPARISON:  None. FINDINGS: Intrauterine gestational sac: Single Yolk sac:  Visualized. Embryo:  Not Visualized. Cardiac Activity: Not Visualized. MSD: 8.8 mm   5 w   4 d Subchorionic hemorrhage:  None visualized. Maternal uterus/adnexae: There is a 1.9 cm corpus luteal cyst in the left ovary. There is a 6.0 x 3.3 x 4.3 cm simple cyst in the right ovary. Trace free fluid in the pelvis. IMPRESSION: 1. Early intrauterine just sec with a yolk sac but no fetal pole or cardiac activity yet visualized. Recommend follow-up quantitative B-HCG levels and follow-up US in 10-14 days to assess viability. This recommendation follows SRU consensus guidelines: Diagnostic Criteria for Nonviable Pregnancy Early in the First Trimester. Malva Limes Med 2013; 914:7829-56. 2. Simple right ovarian cyst measuring 6 cm. This is likely incidental in the absence of pelvic pain Electronically Signed   By: Narda Rutherford M.D.   On: 07/14/2018 22:24    MAU COURSE Orders Placed This Encounter  Procedures  . US OB Transvaginal  . Discharge patient   No orders of the defined types were placed in this encounter.   MDM RH positive Small amount of blood on exam Ultrasound unchanged from last night. Patient has f/u viability  ultrasound already scheduled.   ASSESSMENT 1. Threatened miscarriage in early pregnancy   2. Vaginal bleeding in pregnancy, first trimester     PLAN Discharge home in stable condition. Bleeding precautions  Allergies as of 07/15/2018   No Known Allergies     Medication  List    TAKE these medications   FOLIC ACID PO Take by mouth. 800mg  once a day   multivitamin-prenatal 27-0.8 MG Tabs tablet Take 1 tablet by mouth daily at 12 noon.   Vitamin D 50 MCG (2000 UT) Caps Take 1 capsule (2,000 Units total) by mouth daily.        Judeth HornLawrence, Miquela Costabile, NP 07/15/2018  8:38 PM

## 2018-07-15 NOTE — MAU Note (Signed)
Was here last night, bleeding and pain in early preg.  Since she got home, the bleeding and the pain has increased. Every time she moves, blood comes out.

## 2018-07-16 LAB — GC/CHLAMYDIA PROBE AMP (~~LOC~~) NOT AT ARMC
Chlamydia: NEGATIVE
Neisseria Gonorrhea: NEGATIVE

## 2018-07-28 ENCOUNTER — Ambulatory Visit (HOSPITAL_COMMUNITY): Admission: RE | Admit: 2018-07-28 | Payer: BLUE CROSS/BLUE SHIELD | Source: Ambulatory Visit

## 2019-08-16 ENCOUNTER — Ambulatory Visit: Payer: Self-pay | Attending: Internal Medicine

## 2019-08-16 DIAGNOSIS — Z23 Encounter for immunization: Secondary | ICD-10-CM

## 2019-08-16 NOTE — Progress Notes (Signed)
   Covid-19 Vaccination Clinic  Name:  Christine Le    MRN: [id number] DOB: 1980-11-17  08/16/2019  Christine Le was observed post Covid-19 immunization for 15 minutes without incident. She was provided with Vaccine Information Sheet and instruction to access the V-Safe system.   Christine Le was instructed to call 911 with any severe reactions post vaccine: Marland Kitchen Difficulty breathing  . Swelling of face and throat  . A fast heartbeat  . A bad rash all over body  . Dizziness and weakness   Immunizations Administered    Name Date Dose VIS Date Route   Moderna COVID-19 Vaccine 08/16/2019  1:03 PM 0.5 mL 03/2019 Intramuscular   Manufacturer: Moderna   Lot: [id number]   NDC: 82574-935-52

## 2019-08-27 ENCOUNTER — Encounter (HOSPITAL_COMMUNITY): Payer: Self-pay | Admitting: Emergency Medicine

## 2019-08-27 ENCOUNTER — Other Ambulatory Visit: Payer: Self-pay

## 2019-08-27 ENCOUNTER — Emergency Department (HOSPITAL_COMMUNITY): Payer: 59

## 2019-08-27 ENCOUNTER — Emergency Department (HOSPITAL_COMMUNITY)
Admission: EM | Admit: 2019-08-27 | Discharge: 2019-08-27 | Disposition: A | Discharge status: Home / Self Care | Payer: 59 | Attending: Emergency Medicine | Admitting: Emergency Medicine

## 2019-08-27 DIAGNOSIS — R42 Dizziness and giddiness: Secondary | ICD-10-CM | POA: Insufficient documentation

## 2019-08-27 DIAGNOSIS — R0789 Other chest pain: Secondary | ICD-10-CM | POA: Diagnosis present

## 2019-08-27 DIAGNOSIS — R000 Tachycardia, unspecified: Secondary | ICD-10-CM | POA: Diagnosis not present

## 2019-08-27 DIAGNOSIS — Z79899 Other long term (current) drug therapy: Secondary | ICD-10-CM | POA: Insufficient documentation

## 2019-08-27 DIAGNOSIS — R079 Chest pain, unspecified: Secondary | ICD-10-CM

## 2019-08-27 DIAGNOSIS — Z20822 Contact with and (suspected) exposure to covid-19: Secondary | ICD-10-CM | POA: Diagnosis not present

## 2019-08-27 LAB — BASIC METABOLIC PANEL
Anion gap: 9 (ref 5–15)
BUN: 15 mg/dL (ref 6–20)
CO2: 24 mmol/L (ref 22–32)
Calcium: 8.6 mg/dL — ABNORMAL LOW (ref 8.9–10.3)
Chloride: 106 mmol/L (ref 98–111)
Creatinine, Ser: 0.81 mg/dL (ref 0.44–1.00)
GFR calc Af Amer: >60 mL/min (ref >60)
GFR calc non Af Amer: >60 mL/min (ref >60)
Glucose, Bld: 113 mg/dL — ABNORMAL HIGH (ref 70–99)
Potassium: 3.7 mmol/L (ref 3.5–5.1)
Sodium: 139 mmol/L (ref 135–145)

## 2019-08-27 LAB — CBC
HCT: 42.1 % (ref 36.0–46.0)
Hemoglobin: 13.6 g/dL (ref 12.0–15.0)
MCH: 28.9 pg (ref 26.0–34.0)
MCHC: 32.3 g/dL (ref 30.0–36.0)
MCV: 89.4 fL (ref 80.0–100.0)
Platelets: 264 10*3/uL (ref 150–400)
RBC: 4.71 MIL/uL (ref 3.87–5.11)
RDW: 12.3 % (ref 11.5–15.5)
WBC: 7.4 10*3/uL (ref 4.0–10.5)
nRBC: 0 % (ref 0.0–0.2)

## 2019-08-27 LAB — SARS CORONAVIRUS 2 (TAT 6-24 HRS): SARS Coronavirus 2: NEGATIVE

## 2019-08-27 LAB — I-STAT BETA HCG BLOOD, ED (MC, WL, AP ONLY): I-stat hCG, quantitative: <5 m[IU]/mL (ref <5)

## 2019-08-27 LAB — TROPONIN I (HIGH SENSITIVITY): Troponin I (High Sensitivity): <2 ng/L (ref <18)

## 2019-08-27 LAB — D-DIMER, QUANTITATIVE: D-Dimer, Quant: 0.34 ug/mL-FEU (ref 0.00–0.50)

## 2019-08-27 MED ORDER — ACETAMINOPHEN 325 MG PO TABS
650.0000 mg | ORAL_TABLET | Freq: Once | ORAL | Status: AC
  Administered 2019-08-27: 650 mg via ORAL
  Filled 2019-08-27: qty 2

## 2019-08-27 MED ORDER — SODIUM CHLORIDE 0.9% FLUSH: 3.0000 mL | Freq: Once | INTRAVENOUS | Status: DC

## 2019-08-27 NOTE — ED Provider Notes (Signed)
[facility] EMERGENCY DEPARTMENT Provider Note   CSN: [id number] Arrival date & time: 08/27/19  0841     History Chief Complaint  Patient presents with  . Chest Pain    Christine Le is a 39 y.o. female.  HPI 39 year old female with no significant medical history presents for left-sided chest pain that began yesterday morning while she was at work and progressively got worse overnight.  She states that she has a history of feeling like her heart is racing, and this was occurring last night as well.   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  She says she sometimes gets this kind of pain and she takes some Tylenol and it goes away but this time her chest pain continued to get worse which was concerning for her.  She states that the pain is worse on movement and worse with deep inspiration.  She denies any recent travel, sedentary lifestyle, immunocompromise state, OCP use.  She also stated that yesterday she had an episode where she felt like her left arm and leg were tingly and numb.  This lasted approximately 10 minutes and then returned back to normal.  She also says that she intermittently gets dizzy and sometimes feels like her head is numb but she is not currently having the symptoms right now.  She also has noticed that she has been more forgetful recently and is worried that she might have had a stroke.   She has no numbness or tingling in her arm at this time.  She denies any headaches, vision changes, SOB, diaphoresis, radiating shoulder pain,falls/injuries, nausea, vomiting, neck pain, neck stiffness, abdominal pain, dizziness.  She does her annual visits with her OB/GYN but does not see a cardiologist or neurologist.    History reviewed. No pertinent past medical history.  Patient Active Problem List   Diagnosis Date Noted  . Perforation of left tympanic membrane  07/12/2016  . Impaired fasting blood sugar 07/12/2016  . Routine general medical examination at a health care facility 07/12/2016  . Vaccine counseling 07/12/2016  . Headache syndrome 07/12/2016  . Postoperative state 05/27/2013  . Indication for care in labor or delivery 05/25/2013    Past Surgical History:  Procedure Laterality Date  . CESAREAN SECTION N/A 05/26/2013   Procedure: Primary Cesarean Section Delivery Baby Boy @ 2129, Apgar 8/9;  Surgeon: Loney Laurence, MD;  Location: [facility];  Service: Obstetrics;  Laterality: N/A;     OB History    Gravida  2   Para  1   Term  1   Preterm      AB      Living  1     SAB      TAB      Ectopic      Multiple      Live Births  1           Family History  Problem Relation Age of Onset  . Rheumatologic disease Mother   . Hypertension Father   . Stroke Maternal Grandfather   . Down syndrome Brother   . Cancer Neg Hx   . Heart disease Neg Hx   . Depression Neg Hx     Social History   Tobacco Use  . Smoking status: Never Smoker  . Smokeless tobacco: Never Used  Substance Use Topics  . Alcohol use: Not  Currently    Alcohol/week: 3.0 standard drinks    Types: 3 Glasses of wine per week  . Drug use: No    Home Medications Prior to Admission medications   Medication Sig Start Date End Date Taking? Authorizing Provider  Cholecalciferol (VITAMIN D) 2000 units CAPS Take 1 capsule (2,000 Units total) by mouth daily. 07/11/16  Yes Tysinger, Kermit Balo, PA-C  folic acid (FOLVITE) 800 MCG tablet Take 800 mcg by mouth daily.    Yes [provider]  Prenatal Vit-Fe Fumarate-FA (MULTIVITAMIN-PRENATAL) 27-0.8 MG TABS tablet Take 1 tablet by mouth daily at 12 noon.   Yes [provider]    Allergies    Patient has no known allergies.  Review of Systems   Review of Systems  Constitutional: Negative for chills and fever.  HENT: Negative for ear pain and sore throat.   Eyes: Negative for pain and  visual disturbance.  Respiratory: Negative for cough and shortness of breath.   Cardiovascular: Positive for chest pain and palpitations.  Gastrointestinal: Negative for abdominal pain, diarrhea, nausea and vomiting.  Genitourinary: Negative for dysuria, hematuria and pelvic pain.  Musculoskeletal: Negative for arthralgias, back pain, myalgias, neck pain and neck stiffness.  Skin: Negative for color change and rash.  Allergic/Immunologic: Negative for immunocompromised state.  Neurological: Positive for dizziness, light-headedness and numbness. Negative for tremors, seizures, syncope, speech difficulty, weakness and headaches.  Psychiatric/Behavioral: Negative for confusion.  All other systems reviewed and are negative.   Physical Exam Updated Vital Signs BP 111/77   Pulse 68   Temp 98.4 F (36.9 C) (Oral)   Resp 15   Ht 5\' 5"  (1.651 m)   Wt 56.7 kg   SpO2 100%   BMI 20.80 kg/m   Physical Exam Vitals and nursing note reviewed.  Constitutional:      General: She is not in acute distress.    Appearance: She is well-developed and normal weight. She is not ill-appearing, toxic-appearing or diaphoretic.  HENT:     Head: Normocephalic and atraumatic.  Eyes:     Extraocular Movements: Extraocular movements intact.     Conjunctiva/sclera: Conjunctivae normal.     Pupils: Pupils are equal, round, and reactive to light.  Cardiovascular:     Rate and Rhythm: Normal rate and regular rhythm.     Pulses:          Radial pulses are 2+ on the right side and 2+ on the left side.       Dorsalis pedis pulses are 2+ on the right side and 2+ on the left side.     Heart sounds: Normal heart sounds. No murmur.  Pulmonary:     Effort: Pulmonary effort is normal. No tachypnea, accessory muscle usage or respiratory distress.     Breath sounds: Normal breath sounds. No stridor. No decreased breath sounds, wheezing or rhonchi.  Chest:     Chest wall: Tenderness present. No mass, crepitus or edema.  There is no dullness to percussion.  Abdominal:     General: Bowel sounds are normal.     Palpations: Abdomen is soft.     Tenderness: There is no abdominal tenderness.  Musculoskeletal:        General: Normal range of motion.     Cervical back: Normal range of motion and neck supple.     Right lower leg: No tenderness. No edema.     Left lower leg: No tenderness. No edema.  Skin:    General: Skin is warm and  dry.  Neurological:     General: No focal deficit present.     Mental Status: She is alert and oriented to person, place, and time.     Cranial Nerves: No cranial nerve deficit.     Motor: No weakness.     Comments: Mental Status:  Alert, thought content appropriate, able to give a coherent history. Speech fluent without evidence of aphasia. Able to follow 2 step commands without difficulty.  Cranial Nerves:  II: Peripheral visual fields grossly normal, pupils equal, round, reactive to light III,IV, VI: ptosis not present, extra-ocular motions intact bilaterally  V,VII: smile symmetric, facial light touch sensation equal VIII: hearing grossly normal to voice  X: uvula elevates symmetrically  XI: bilateral shoulder shrug symmetric and strong XII: midline tongue extension without fassiculations Motor:  Normal tone. 5/5 strength of BUE and BLE major muscle groups including strong and equal grip strength and dorsiflexion/plantar flexion Sensory: light touch normal in all extremities. Cerebellar: normal finger-to-nose with bilateral upper extremities, Romberg sign absent Gait: normal gait and balance. Able to walk on toes and heels with ease.    Psychiatric:        Mood and Affect: Mood normal.        Behavior: Behavior normal.     ED Results / Procedures / Treatments   Labs (all labs ordered are listed, but only abnormal results are displayed) Labs Reviewed  BASIC METABOLIC PANEL - Abnormal; Notable for the following components:      Result Value   Glucose, Bld 113 (*)     Calcium 8.6 (*)    All other components within normal limits  SARS CORONAVIRUS 2 (TAT 6-24 HRS)  CBC  D-DIMER, QUANTITATIVE (NOT AT [facility])  I-STAT BETA HCG BLOOD, ED ([facility], [facility], [facility] ONLY)  TROPONIN I (HIGH SENSITIVITY)    EKG EKG Interpretation  Date/Time:  Thursday Aug 27 2019 08:48:33 EDT Ventricular Rate:  83 PR Interval:  140 QRS Duration: 102 QT Interval:  382 QTC Calculation: 448 R Axis:   96 Text Interpretation: Normal sinus rhythm with sinus arrhythmia Rightward axis Incomplete right bundle branch block Nonspecific T wave abnormality Abnormal ECG No STEMI Confirmed by Alvester Chou [phone]) on 08/27/2019 9:25:42 AM   Radiology DG Chest 2 View  Result Date: 08/27/2019 CLINICAL DATA:  Pt reports L sided chest pain that got worse over night. States that she has been noticing palpitations recently. Pt reports pain is worse with movement and deep inspiration. Denies recent travel or pain to lower legs. EXAM: CHEST - 2 VIEW COMPARISON:  None. FINDINGS: Normal heart, mediastinum and hila. Clear lungs.  No pleural effusion or pneumothorax. Skeletal structures are unremarkable. IMPRESSION: Normal chest radiographs. Electronically Signed   By: Amie Portland M.D.   On: 08/27/2019 09:14    Procedures Procedures (including critical care time)  Medications Ordered in ED Medications  sodium chloride flush (NS) 0.9 % injection 3 mL (has no administration in time range)  acetaminophen (TYLENOL) tablet 650 mg (650 mg Oral Given 08/27/19 1310)    ED Course  I have reviewed the triage vital signs and the nursing notes.  Pertinent labs & imaging results that were available during my care of the patient were reviewed by me and considered in my medical decision making (see chart for details).    MDM Rules/Calculators/A&P                     39 year old female with no significant medical history with left-sided  chest pain and one episode of tingling in her left arm and the left leg last  week. On presentation to the ER, patient is alert and oriented, in no acute distress, moving about the ER room with no difficulty.  Vitals overall reassuring.  Physical exam positive for reproducible left-sided chest wall tenderness with no masses, crepitus, deformities.  No midline tenderness in C-spine T-spine or L-spine.  Normal range of motion of shoulder, 2+ radial pulses.  Physical exam without neuro deficits.    The emergent causes of chest pain include: Acute coronary syndrome, tamponade, pericarditis/myocarditis, aortic dissection, pulmonary embolism, tension pneumothorax, pneumonia, and esophageal rupture.  Other urgent/non-acute considerations include, but are not limited to: chronic angina, aortic stenosis, cardiomyopathy, mitral valve prolapse, pulmonary hypertension, aortic insufficiency, right ventricular hypertrophy, pleuritis, bronchitis, pneumothorax, tumor, gastroesophageal reflux disease (GERD), esophageal spasm, Mallory-Weiss syndrome, peptic ulcer disease, pancreatitis, functional gastrointestinal pain, cervical or thoracic disk disease or arthritis, shoulder arthritis, costochondritis, subacromial bursitis, anxiety or panic attack, herpes zoster, breast disorders, chest wall tumors, thoracic outlet syndrome, mediastinitis.  BMP without significant electrolyte abnormalities, normal creatinine.  CBC without leukocytosis, normal hemoglobin and platelets.  Initial troponin nondetectable.  Chest x-ray without acute abnormality.  EKG sinus rhythm with sinus arrhythmia and incomplete right bundle blanch block, overall not concerning.  D-dimer negative.  Given this and reproducible chest wall pain, doubt ACS, PE, pneumothorax, dissection, tamponade, esophageal rupture.  Suspect her cause of chest pain to be musculoskeletal.  Encouraged the patient to continue taking Tylenol over-the-counter for her symptoms and urged her to follow-up with her PCP about her intermittent numbness and tingling  about her neuro symptoms and palpitations.  Patient voices understanding is agreeable to this plan.  At this stage in the ED course, the patient was adequately screened and is stable for discharge.    I discussed the case with Dr. Langston Masker and he is agreeable to this plan. Final Clinical Impression(s) / ED Diagnoses Final diagnoses:  Chest pain, unspecified type    Rx / DC Orders ED Discharge Orders    None       Lyndel Safe 08/27/19 1336    Wyvonnia Dusky, MD 08/27/19 [phone]

## 2019-08-27 NOTE — Discharge Instructions (Signed)
Your work-up today was overall reassuring.  Your chest pain is likely due to a muscle strain.  Continue to take over-the-counter Tylenol for your symptoms.  Please make sure to follow-up with your family doctor or OB/GYN as discussed about your intermittent numbness and tingling in your hands.  I do not think that you have any life-threatening cause of chest pain or stroke.  Return to the ER if your symptoms worsen.

## 2019-08-27 NOTE — ED Notes (Signed)
Patient verbalizes understanding of discharge instructions. Opportunity for questioning and answers were provided. Armband removed by staff, pt discharged from ED.  

## 2019-08-27 NOTE — ED Triage Notes (Signed)
Pt reports L sided chest pain that got worse over night. States that she has been noticing palpitations recently. Pt reports pain is worse with movement and deep inspiration. Denies recent travel or pain to lower legs.

## 2019-08-28 ENCOUNTER — Telehealth: Payer: Self-pay | Admitting: Medical

## 2019-08-28 NOTE — Telephone Encounter (Signed)
Call about recent ED visit.  Get in for followup

## 2019-08-31 ENCOUNTER — Telehealth: Payer: Self-pay

## 2019-08-31 NOTE — Telephone Encounter (Signed)
Left VM for pt to call the office and schedule appt

## 2019-08-31 NOTE — Telephone Encounter (Signed)
Pt. Called back to get scheduled for her hospital f/u and I got her scheduled on 09/14/19 per pt. Requesting.

## 2019-09-13 ENCOUNTER — Ambulatory Visit: Payer: 59 | Attending: Internal Medicine

## 2019-09-13 DIAGNOSIS — Z23 Encounter for immunization: Secondary | ICD-10-CM

## 2019-09-13 NOTE — Progress Notes (Signed)
   Covid-19 Vaccination Clinic  Name:  Christine Le    MRN: [id number] DOB: 03-06-1981  09/13/2019  [name] was observed post Covid-19 immunization for 15 minutes without incident. She was provided with Vaccine Information Sheet and instruction to access the V-Safe system.   [name] was instructed to call 911 with any severe reactions post vaccine: Marland Kitchen Difficulty breathing  . Swelling of face and throat  . A fast heartbeat  . A bad rash all over body  . Dizziness and weakness   Immunizations Administered    Name Date Dose VIS Date Route   Moderna COVID-19 Vaccine 09/13/2019  1:14 PM 0.5 mL 03/2019 Intramuscular   Manufacturer: Moderna   Lot: [id number]   NDC: 16553-748-27

## 2019-09-14 ENCOUNTER — Other Ambulatory Visit: Payer: Self-pay

## 2019-09-14 ENCOUNTER — Ambulatory Visit: Payer: 59 | Admitting: Medical

## 2019-09-14 ENCOUNTER — Encounter: Payer: Self-pay | Admitting: Medical

## 2019-09-14 VITALS — BP 100/62 | HR 96 | Ht 64.0 in | Wt 129.0 lb

## 2019-09-14 DIAGNOSIS — R079 Chest pain, unspecified: Secondary | ICD-10-CM | POA: Insufficient documentation

## 2019-09-14 DIAGNOSIS — Z8759 Personal history of other complications of pregnancy, childbirth and the puerperium: Secondary | ICD-10-CM

## 2019-09-14 DIAGNOSIS — R202 Paresthesia of skin: Secondary | ICD-10-CM | POA: Diagnosis not present

## 2019-09-14 DIAGNOSIS — R413 Other amnesia: Secondary | ICD-10-CM

## 2019-09-14 DIAGNOSIS — E559 Vitamin D deficiency, unspecified: Secondary | ICD-10-CM

## 2019-09-14 DIAGNOSIS — R5383 Other fatigue: Secondary | ICD-10-CM | POA: Diagnosis not present

## 2019-09-14 HISTORY — DX: Chest pain, unspecified: R07.9

## 2019-09-14 HISTORY — DX: Vitamin D deficiency, unspecified: E55.9

## 2019-09-14 HISTORY — DX: Other fatigue: R53.83

## 2019-09-14 NOTE — Progress Notes (Signed)
Subjective: Chief Complaint  Patient presents with  . Follow-up    chest pain-denies chest pain today    Here for emergency department follow-up.  Her last visit was 2018  She went to Emergency department recently for chest pain numbness and tingling in the arms.  She had several labs chest x-ray and EKG.  She was advised to follow-up with PCP  From time to time she gets tingling and numbness in the hands, the whole hand not specific fingers.  But mainly the numbness is from the wrist to the fingertips not the whole arm.  Worse on the left hand and sometimes on the right as well.  No other numbness or tingling or weakness.  Denies neck  pain  She notes recently had several episodes of chest pain with associated numbness and tingling in the arms.  Then she started feeling little short of breath which scared her.  She went to emergency department but no specific cause was found.  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  She is left-handed.  She attributes some of the chest pain to musculoskeletal related issues, soreness.    She feels exhausted at times, sometimes feels changes in memory forgets easy things.  Most of the symptoms with fatigue or memory started this past year when she had a miscarriage last March 2020.  At times feels down, at times crying spells but overall is happy most of the time.  She does not feel that this is a severe issue.  She has not seen counseling.  She does not want to pursue that at this time.  Denies SI/HI  No past medical history on file.  Current Outpatient Medications on File Prior to Visit  Medication Sig Dispense Refill  . Cholecalciferol (VITAMIN D) 2000 units CAPS Take 1 capsule (2,000 Units total) by mouth daily. 30 capsule 11  . Prenatal Vit-Fe Fumarate-FA (MULTIVITAMIN-PRENATAL) 27-0.8 MG TABS tablet Take 1 tablet by mouth daily at 12 noon.    . folic acid (FOLVITE) 096 MCG  tablet Take 800 mcg by mouth daily.      No current facility-administered medications on file prior to visit.   ROS as in subjective    Objective: BP 100/62   Pulse 96   Ht 5\' 4"  (1.626 m)   Wt 129 lb (58.5 kg)   SpO2 98%   BMI 22.14 kg/m   General appearence: alert, no distress, WD/WN,  Neck: supple, no lymphadenopathy, no thyromegaly, no masses, no bruits, no neck tendnerss, normal ROM Heart: RRR, normal S1, S2, no murmurs Lungs: CTA bilaterally, no wheezes, rhonchi, or rales Arms nontender, normal ROM, no deformity or swelling Pulses: 2+ symmetric, upper and lower extremities, normal cap refill Ext: no edema Neuro: nonfocal, normal heel to toe, normal finger to nose, neg phelans and tinels Psych: pleasant, good eye contact, answers question appropriately     Assessment: Encounter Diagnoses  Name Primary?  . Chest pain, unspecified type Yes  . Paresthesia   . Fatigue, unspecified type   . Vitamin D deficiency   . Memory change   . History of miscarriage      Plan: Reviewed the recent hospital emergency department record, labs chest x-ray EKG  Additional labs as below  Chest pain - resolved  parenthesis - likely carpal tunnel both wrist, mild.  Begin wrist splints QHS, relative rest, labs today for further eval  Fatigue -  labs as below  Vit D deficiency in the past - recheck  Labs   Memory change - likely due to multi tasking and busy at work making up for short staffed, doubt underlying significant issue   Christine Le was seen today for follow-up.  Diagnoses and all orders for this visit:  Chest pain, unspecified type  Paresthesia -     Vitamin B12  Fatigue, unspecified type -     TSH -     VITAMIN D 25 Hydroxy (Vit-D Deficiency, Fractures) -     Vitamin B12 -     RPR  Vitamin D deficiency -     VITAMIN D 25 Hydroxy (Vit-D Deficiency, Fractures)  Memory change -     TSH -     VITAMIN D 25 Hydroxy (Vit-D Deficiency, Fractures) -     Vitamin  B12 -     RPR  History of miscarriage   F/u pending labs

## 2019-09-15 LAB — TSH: TSH: 0.666 u[IU]/mL (ref 0.450–4.500)

## 2019-09-15 LAB — VITAMIN B12: Vitamin B-12: 1017 pg/mL (ref 232–1245)

## 2019-09-15 LAB — VITAMIN D 25 HYDROXY (VIT D DEFICIENCY, FRACTURES): Vit D, 25-Hydroxy: 50.9 ng/mL (ref 30.0–100.0)

## 2019-09-15 LAB — RPR: RPR Ser Ql: NONREACTIVE

## 2020-06-09 ENCOUNTER — Other Ambulatory Visit: Payer: Self-pay | Admitting: Family Medicine

## 2020-11-05 IMAGING — US TRANSVAGINAL OB ULTRASOUND
1 series · 15 of 28 positions shown · non-contrast
Comparison: 07/14/2018

CLINICAL DATA: Bleeding since prior exam. Positive urine pregnancy
test.

EXAM:
TRANSVAGINAL OB ULTRASOUND
TECHNIQUE: Transvaginal ultrasound was performed for complete evaluation of the
gestation as well as the maternal uterus, adnexal regions, and
pelvic cul-de-sac.

[Series 1: transvaginal ob ultrasound · 15 of 32 slices shown]
[im 1/32]
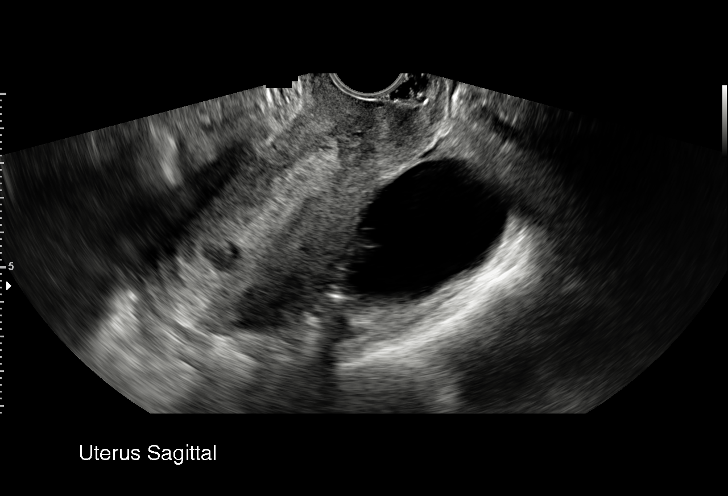
[im 3/32]
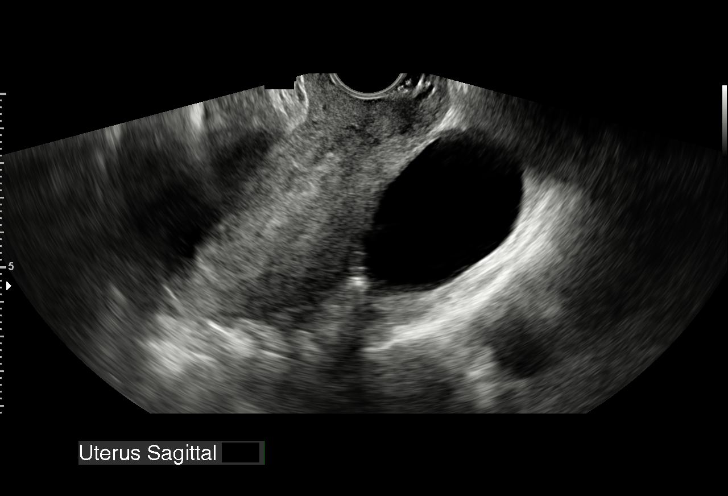
[im 5/32]
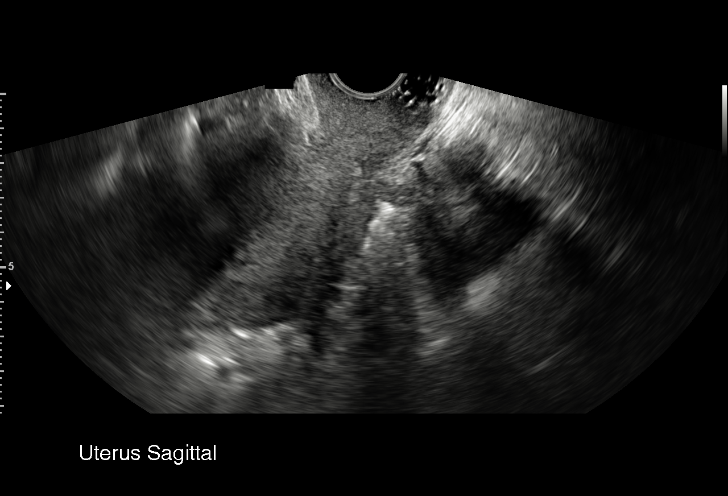
[im 7/32]
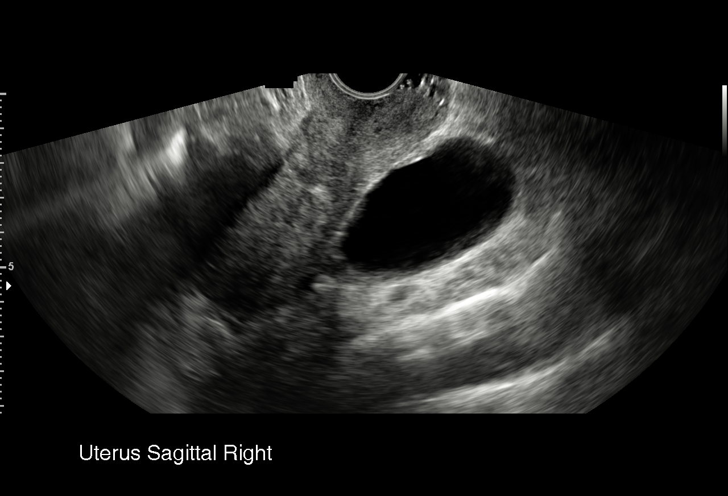
[im 10/32]
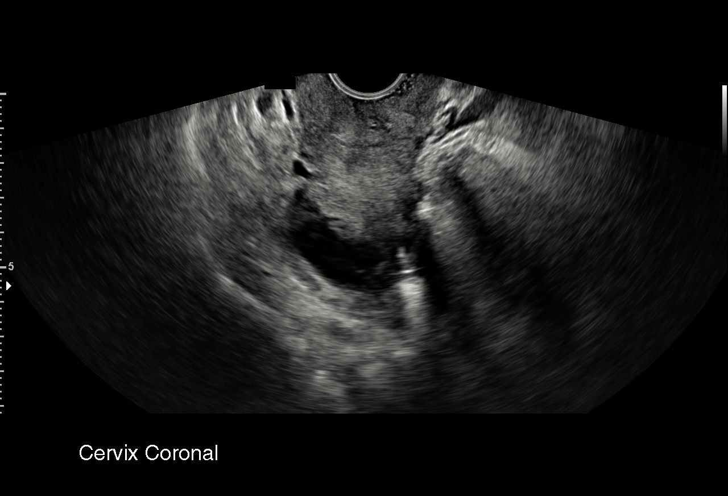
[im 12/32]
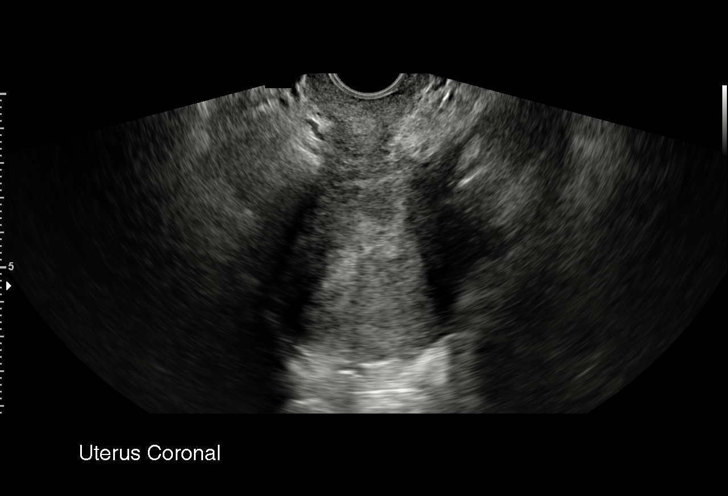
[im 14/32]
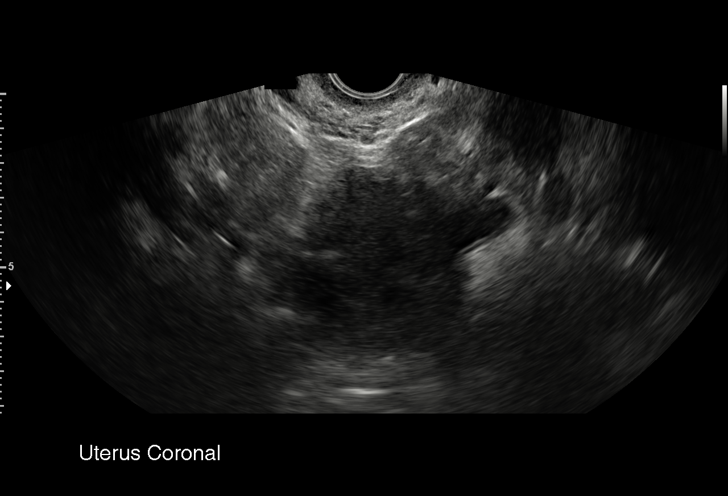
[im 17/32]
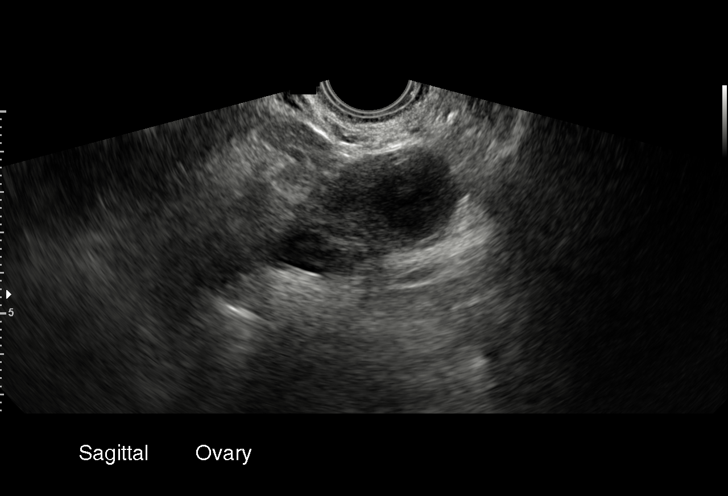
[im 18/32]
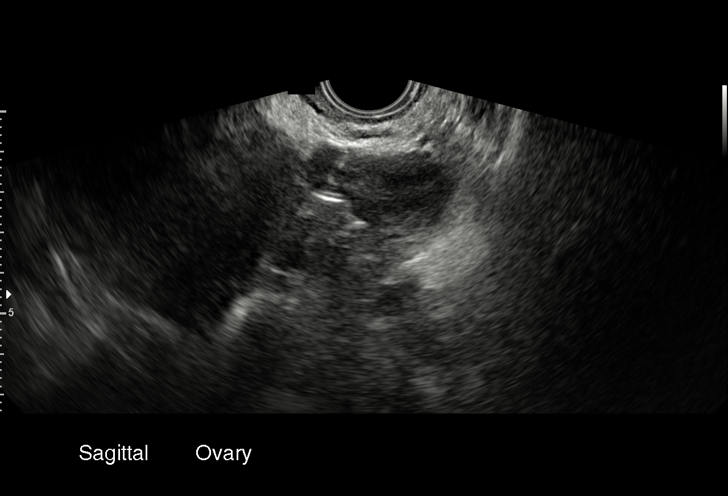
[im 20/32]
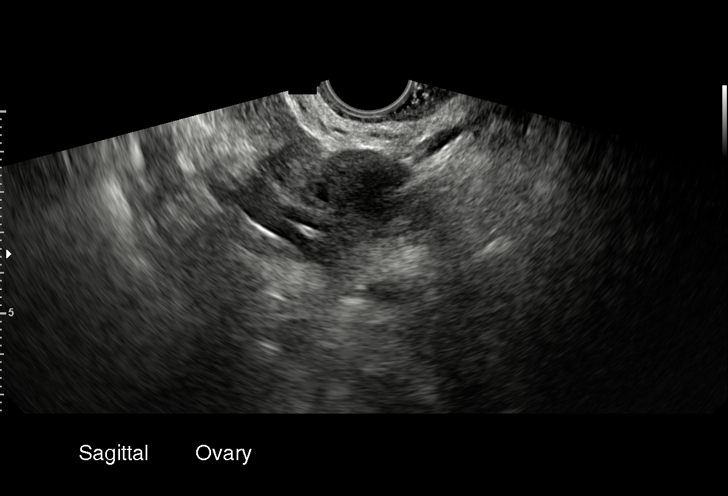
[im 22/32]
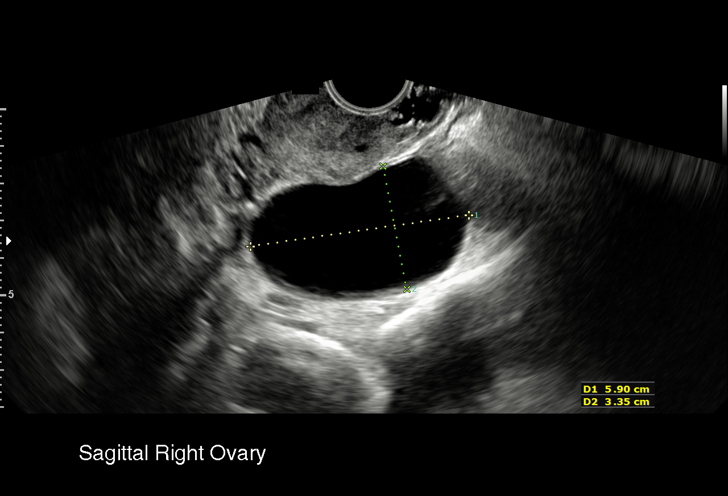
[im 25/32]
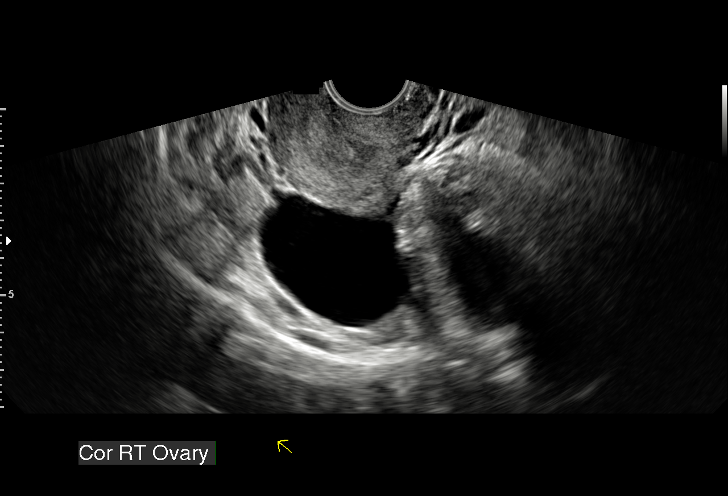
[im 27/32]
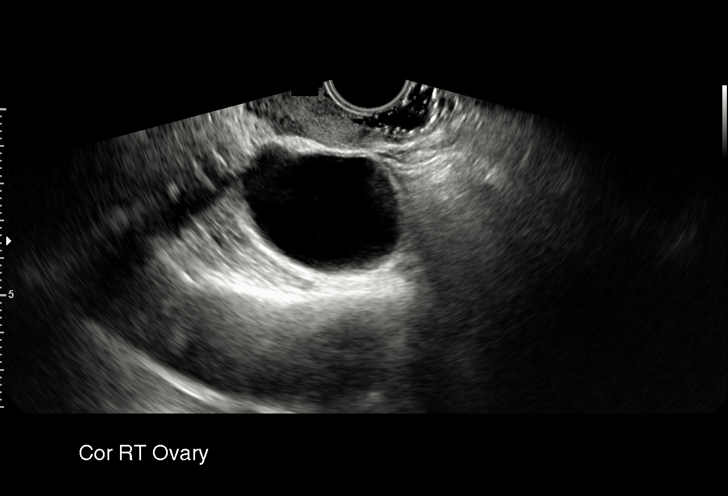
[im 29/32]
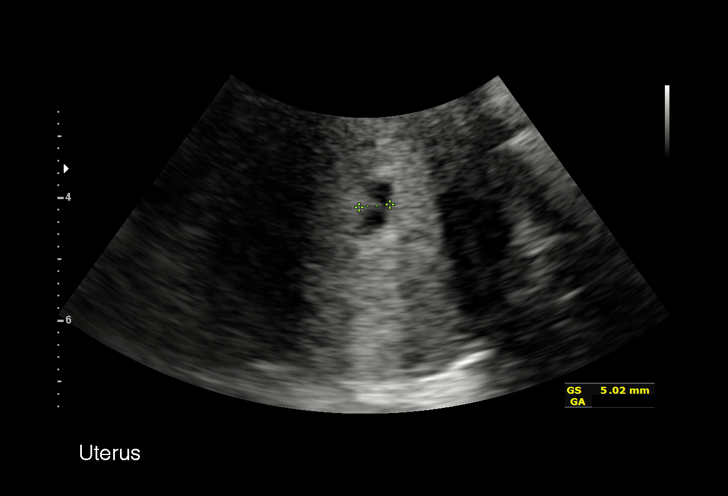
[im 32/32]
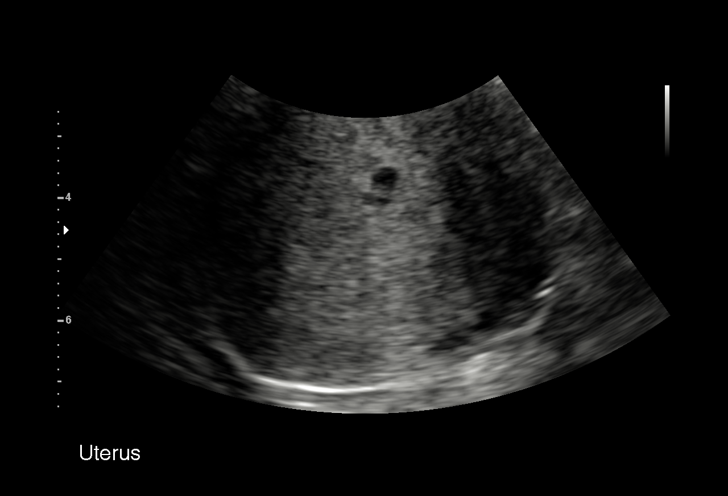

[15 of 28 positions shown; findings below may reference images not displayed]

FINDINGS: Intrauterine gestational sac: Single

Yolk sac:  Visualized.

Embryo:  Not Visualized.

Cardiac Activity: Not Visualized.

Heart Rate: Not applicable

MSD: 8.09 mm   5 w   for d

Subchorionic hemorrhage: None

Maternal uterus/adnexae: Anechoic right ovarian simple cyst
measuring 5.9 x 3.4 x 4.8 cm, without significant change since 1 day
prior. Normal left ovary.

Free fluid: None
IMPRESSION: 1. No findings for the patient's vaginal bleeding. Redemonstration
of early intrauterine gestational sac with yolk sac but no fetal
pole or cardiac activity and visualized.Recommend follow-up
quantitative B-HCG levels and follow-up US in 10-14 days to assess
viability. This recommendation follows SRU consensus guidelines:
Diagnostic criteria for Nonviable Pregnancy Early in the First
Trimester. N Engl J Med 0589; [DATE].
2. Stable simple cyst of the right ovary currently estimated at
x 3 4 x 4.8 cm.

## 2021-12-18 IMAGING — CR DG CHEST 2V
2 series · 2 of 2 positions shown · non-contrast
Comparison: None.

CLINICAL DATA: Pt reports L sided chest pain that got worse over
night. States that she has been noticing palpitations recently. Pt
reports pain is worse with movement and deep inspiration. Denies
recent travel or pain to lower legs.

EXAM:
CHEST - 2 VIEW

[chest pa]
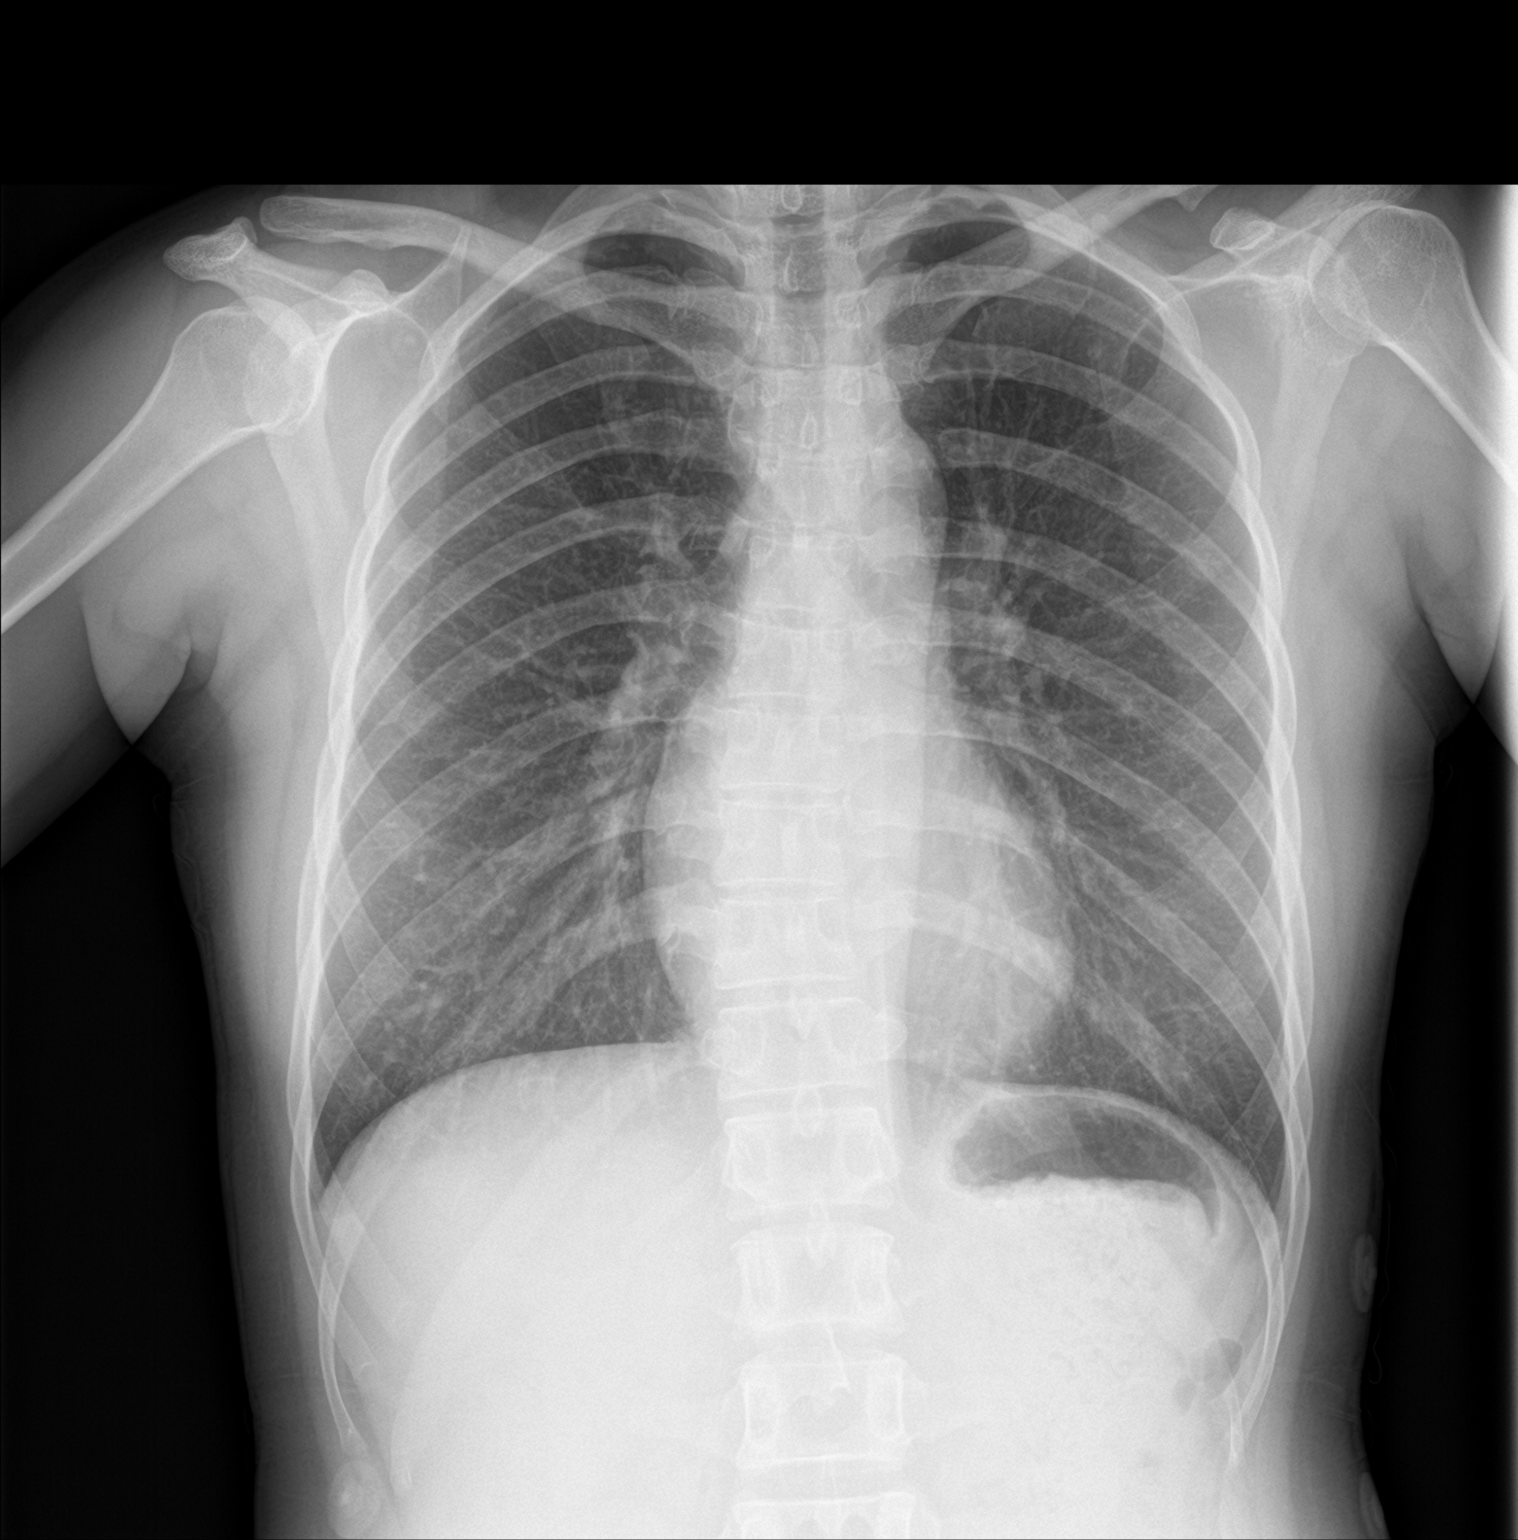

[chest lat]
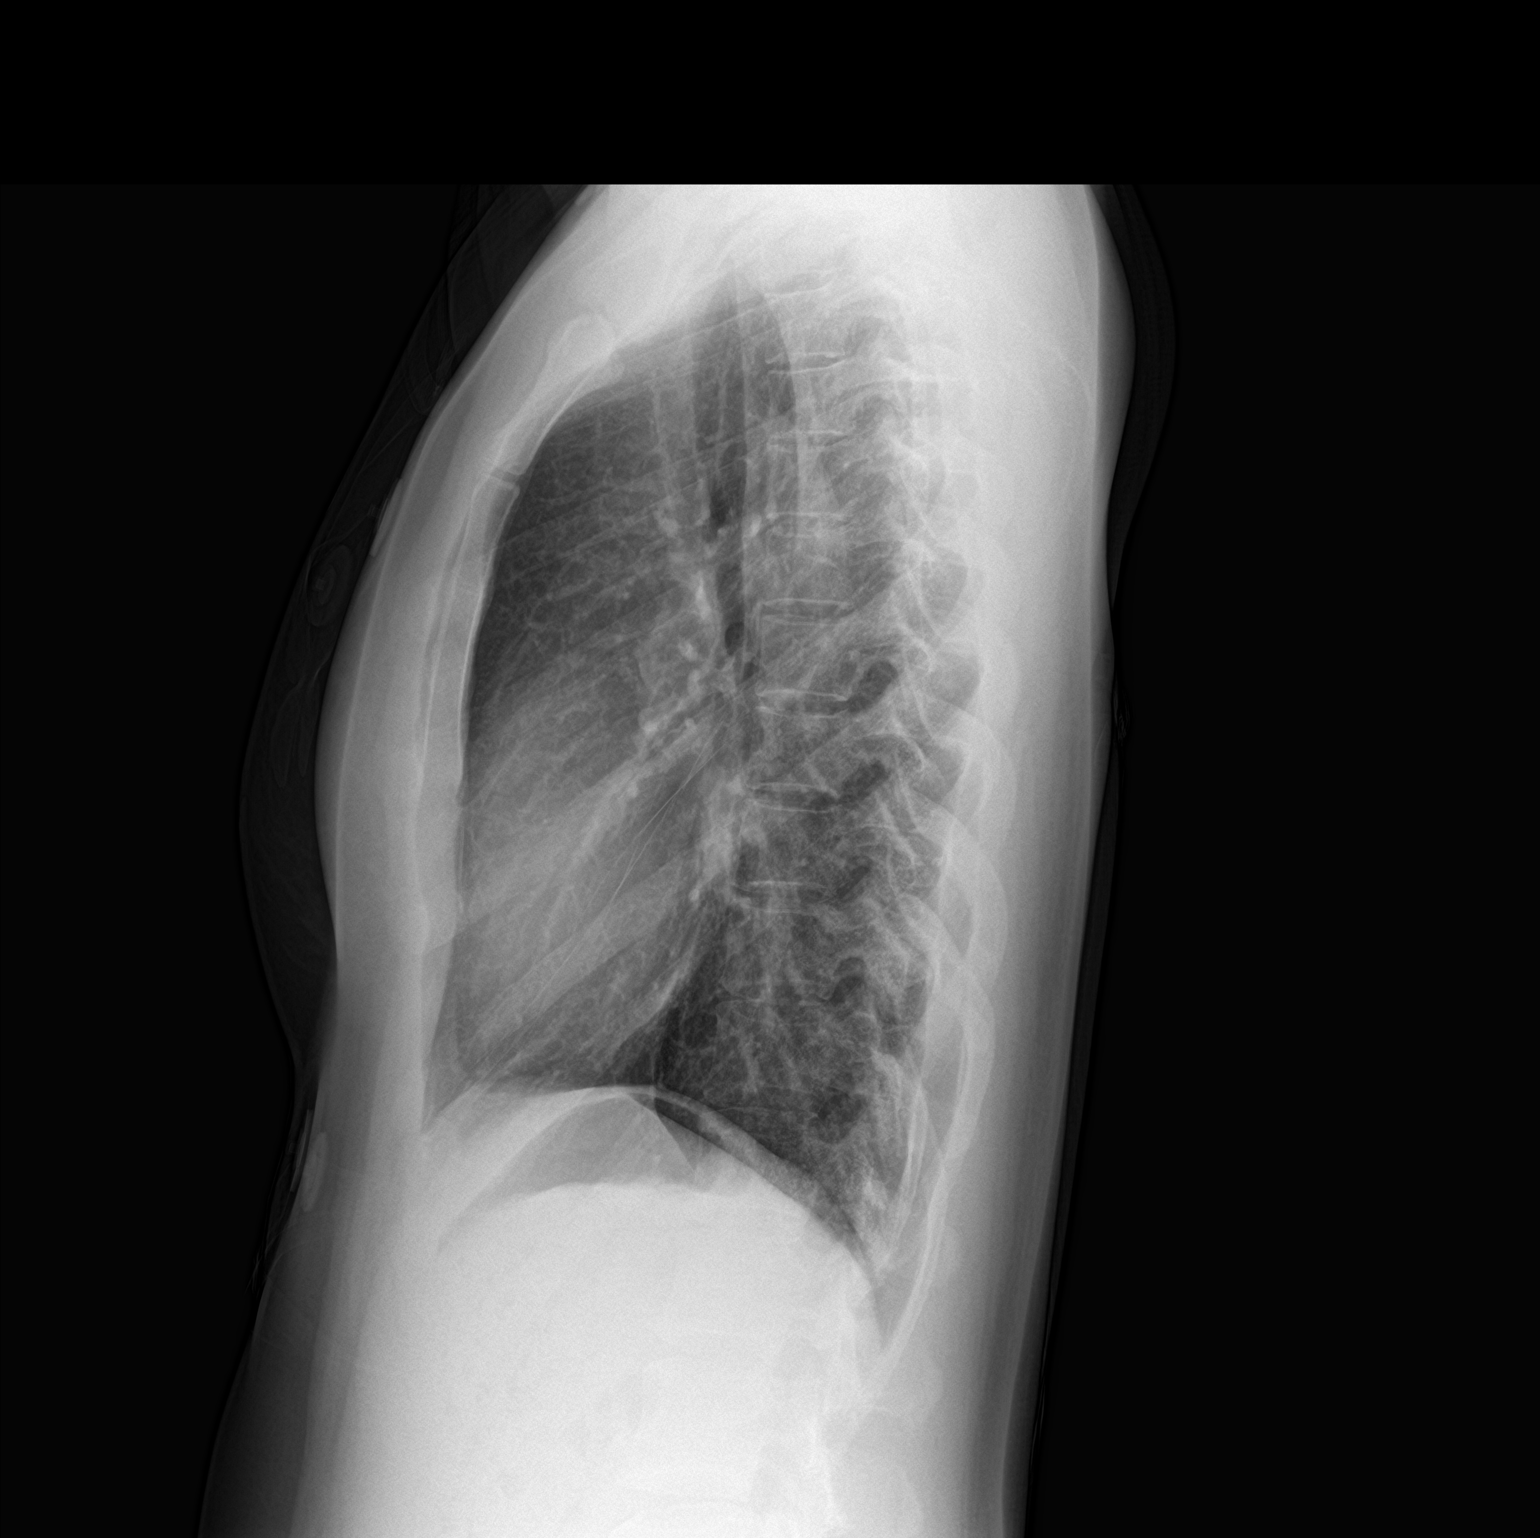

[2 of 2 positions shown; findings below may reference images not displayed]

FINDINGS: Normal heart, mediastinum and hila.

Clear lungs.  No pleural effusion or pneumothorax.

Skeletal structures are unremarkable.
IMPRESSION: Normal chest radiographs.

## 2023-10-04 ENCOUNTER — Ambulatory Visit: Payer: Self-pay

## 2023-10-04 NOTE — Telephone Encounter (Signed)
 FYI Only or Action Required?: FYI only for provider  Patient was last seen in primary care on n/a due to non-established patient. Called Nurse Triage reporting Edema. Symptoms began several weeks ago. Interventions attempted: Nothing. Symptoms are: comes and goes.  Triage Disposition: No disposition on file.  Patient/caregiver understands and will follow disposition?: Yes   Copied from CRM [id number]. Topic: Clinical - Red Word Triage >> Oct 04, 2023 12:37 PM Donald Frost wrote: Red Word that prompted transfer to Nurse Triage: The patient called in stating for several months she has been dealing with swelling in her fingers and sometimes hands and both feet. She states she cannot wear her wedding ring because of it and her shoe size has changed from 6 1/2 to 7 to 7 1/2. She also experiences numbness in her fingers. She doesn't have an appt with a provider until the end of July. I will transfer her to E2C2 NT Answer Assessment - Initial Assessment Questions 1. ONSET: When did the swelling start? (e.g., minutes, hours, days)     Ongoing several months 2. LOCATION: What part of the arm is swollen?  Are both arms swollen or just one arm?     Bilateral feet, bilateral arms & hands 3. SEVERITY: How bad is the swelling? (e.g., localized; mild, moderate, severe)   - LOCALIZED: Small area of puffiness or swelling on just one arm   - JOINT SWELLING: Swelling of one joint   - MILD: Puffiness or swelling of hand   - MODERATE: Puffiness or swollen feeling of entire arm    - SEVERE: All of arm looks swollen; pitting edema     Mild to moderate 4. REDNESS: Does the swelling look red or infected?     no 5. PAIN: Is the swelling painful to touch? If Yes, ask: How painful is it?   (Scale 1-10; mild, moderate or severe)  7 or 8/10    Ibuprofen  6. FEVER: Do you have a fever? If Yes, ask: What is it, how was it measured, and when did it start?      no 7. CAUSE: What do you think is causing the arm  swelling?     unknown 8. MEDICAL HISTORY: Do you have a history of heart failure, kidney disease, liver failure, or cancer?     N/a 9. RECURRENT SYMPTOM: Have you had arm swelling before? If Yes, ask: When was the last time? What happened that time?     Ongoing for several months 10. OTHER SYMPTOMS: Do you have any other symptoms? (e.g., chest pain, difficulty breathing)       Numbness and tingling in bilateral hands. 11. PREGNANCY: Is there any chance you are pregnant? When was your last menstrual period?       N/a    During pain episodes HR increases  Protocols used: Arm Swelling and Edema-A-AH

## 2023-10-09 ENCOUNTER — Encounter: Payer: Self-pay | Admitting: Family

## 2023-10-09 ENCOUNTER — Ambulatory Visit (INDEPENDENT_AMBULATORY_CARE_PROVIDER_SITE_OTHER): Payer: Self-pay | Admitting: Family

## 2023-10-09 VITALS — BP 118/78 | HR 80 | Temp 97.7°F | Ht 64.0 in | Wt 138.2 lb

## 2023-10-09 DIAGNOSIS — M25571 Pain in right ankle and joints of right foot: Secondary | ICD-10-CM | POA: Diagnosis not present

## 2023-10-09 DIAGNOSIS — G8929 Other chronic pain: Secondary | ICD-10-CM

## 2023-10-09 DIAGNOSIS — Z13220 Encounter for screening for lipoid disorders: Secondary | ICD-10-CM

## 2023-10-09 DIAGNOSIS — M25441 Effusion, right hand: Secondary | ICD-10-CM | POA: Diagnosis not present

## 2023-10-09 DIAGNOSIS — Z1329 Encounter for screening for other suspected endocrine disorder: Secondary | ICD-10-CM

## 2023-10-09 DIAGNOSIS — M25572 Pain in left ankle and joints of left foot: Secondary | ICD-10-CM

## 2023-10-09 DIAGNOSIS — Z1159 Encounter for screening for other viral diseases: Secondary | ICD-10-CM

## 2023-10-09 DIAGNOSIS — M25442 Effusion, left hand: Secondary | ICD-10-CM | POA: Diagnosis not present

## 2023-10-09 NOTE — Progress Notes (Signed)
 Provider: Christean Courts FNP-C   Marquerite Forsman, Elijio Guadeloupe, NP  Patient Care Team: Mirta Mally, Elijio Guadeloupe, NP as PCP - General (Family Medicine)  Extended Emergency Contact Information Primary Emergency Contact: Pansri,Yart Address: [address]          [place], [place] [address] [place] Home Phone: [phone] Relation: Spouse  Code Status:  Full Code  Goals of care: Advanced Directive information    07/15/2018    6:11 PM  Advanced Directives  Does Patient Have a Medical Advance Directive? No   Would patient like information on creating a medical advance directive? No - Patient declined      Data saved with a previous flowsheet row definition     Chief Complaint  Patient presents with   New Patient (Initial Visit)     Patient has some concerns about some swelling and pain in hands and feet      Discussed the use of AI scribe software for clinical note transcription with the patient, who gave verbal consent to proceed.  History of Present Illness   Christine Le is a 43 year old female here to establish care. She presents with joint pain and swelling in her hands, ankles, feet, and legs.  She has been experiencing pain and swelling in her fingers, arms, and feet for several months, with symptoms worsening in the past month. The stiffness and swelling in her hands have made it difficult to perform daily tasks such as opening bottles, cutting vegetables, and opening doors. Her fingers feel numb and painful, and she is unable to grip objects firmly. She has been taking over-the-counter medications like Tylenol , ibuprofen , and Aleve, which provide only partial relief. No fever or chills are present.  Her family history is notable for her mother having joint arthritis, though the specific type is unclear. Her father has occasional high blood pressure, but no other significant family medical history is noted. She mentions that her hands and feet feel swollen, and she experiences pain  in her ankles and toes. She has noticed a change in her ability to wear rings and bracelets due to swelling.  She has a history of vitamin D  deficiency but denies any other chronic medical conditions. She recalls a past instance where her blood sugar was noted to be high, but she was not diagnosed with diabetes or prescribed medication.  Socially, she drinks one to two glasses of red wine almost every day. She has been living in the [place]  for nearly [place] years, originally from [place], and has one ten-year-old child who is allergic to ibuprofen . During the review of symptoms, she denies any history of high cholesterol, depression, or anxiety. 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    No past medical history on file. Past Surgical History:  Procedure Laterality Date   CESAREAN SECTION N/A 05/26/2013   Procedure: Primary Cesarean Section Delivery Baby Boy @ 2129, Apgar 8/9;  Surgeon: Oddis Bench, MD;  Location: [facility];  Service: Obstetrics;  Laterality: N/A;    No Known Allergies  Allergies as of 10/09/2023   No Known Allergies      Medication List        Accurate as of October 09, 2023  3:03 PM. If you have any questions, ask your nurse or doctor.          folic acid 800 MCG tablet Commonly known as: FOLVITE Take 800 mcg by  mouth daily.   multivitamin-prenatal 27-0.8 MG Tabs tablet Take 1 tablet by mouth daily at 12 noon.   Vitamin D  50 MCG (2000 UT) Caps Take 1 capsule (2,000 Units total) by mouth daily.        Review of Systems  Constitutional:  Negative for appetite change, chills, fatigue, fever and unexpected weight change.  HENT:  Negative for congestion, ear discharge, ear pain, facial swelling, hearing loss, nosebleeds, postnasal drip, rhinorrhea, sinus pressure, sinus pain, sneezing, sore throat, tinnitus and trouble swallowing.   Eyes:  Negative for pain, discharge,  redness, itching and visual disturbance.  Respiratory:  Negative for cough, chest tightness, shortness of breath and wheezing.   Cardiovascular:  Negative for chest pain, palpitations and leg swelling.  Gastrointestinal:  Negative for abdominal distention, abdominal pain, blood in stool, constipation, diarrhea, nausea and vomiting.  Endocrine: Negative for cold intolerance, heat intolerance, polydipsia, polyphagia and polyuria.  Genitourinary:  Negative for difficulty urinating, dysuria, flank pain, frequency and urgency.  Musculoskeletal:  Positive for joint swelling. Negative for arthralgias, back pain, gait problem, myalgias, neck pain and neck stiffness.       Pain and swelling on all fingers and feet   Skin:  Negative for color change, pallor, rash and wound.  Neurological:  Negative for dizziness, syncope, speech difficulty, weakness, light-headedness, numbness and headaches.  Hematological:  Does not bruise/bleed easily.  Psychiatric/Behavioral:  Negative for agitation, behavioral problems, confusion, hallucinations, self-injury, sleep disturbance and suicidal ideas. The patient is not nervous/anxious.     Immunization History  Administered Date(s) Administered   Moderna Sars-Covid-2 Vaccination 08/16/2019, 09/13/2019   Td 04/24/2013   Pertinent  Health Maintenance Due  Topic Date Due   INFLUENZA VACCINE  11/22/2023      07/10/2016   12:33 PM 07/15/2018    6:11 PM 08/27/2019    8:54 AM  Fall Risk  Falls in the past year? No     (RETIRED) Patient Fall Risk Level  Low fall risk  Low fall risk      Data saved with a previous flowsheet row definition   Functional Status Survey:    Vitals:   10/09/23 1326  BP: 118/78  Pulse: 80  Temp: 97.7 F (36.5 C)  SpO2: 97%  Weight: 138 lb 3.2 oz (62.7 kg)  Height: 5' 4 (1.626 m)   Body mass index is 23.72 kg/m. Physical Exam Physical Exam   GENERAL: Alert, cooperative, well developed, no acute distress HEENT: Normocephalic,  normal oropharynx, moist mucous membranes, ears normal, eyes normal, no sinus tenderness NECK: Neck normal CHEST: Clear to auscultation bilaterally, no wheezes, rhonchi, or crackles, no back tenderness CARDIOVASCULAR: Normal heart rate and rhythm, S1 and S2 normal without murmurs ABDOMEN: Soft, non-tender, non-distended, without organomegaly, normal bowel sounds EXTREMITIES: No cyanosis, hands swollen, decreased sensation in feet. Decreased ROM to all fingers with touching thumb to fingers.  NEUROLOGICAL: Cranial nerves grossly intact, moves all extremities without gross motor or sensory deficit      Labs reviewed: No results for input(s): NA, K, CL, CO2, GLUCOSE, BUN, CREATININE, CALCIUM, MG, PHOS in the last 8760 hours. No results for input(s): AST, ALT, ALKPHOS, BILITOT, PROT, ALBUMIN in the last 8760 hours. No results for input(s): WBC, NEUTROABS, HGB, HCT, MCV, PLT in the last 8760 hours. Lab Results  Component Value Date   TSH 0.666 09/14/2019   Lab Results  Component Value Date   HGBA1C 5.6 07/10/2016   Lab Results  Component Value Date   CHOL 150  07/10/2016   HDL 62 07/10/2016   LDLCALC 76 07/10/2016   TRIG 60 07/10/2016   CHOLHDL 2.4 07/10/2016    Significant Diagnostic Results in last 30 days:  No results found.  Assessment/Plan  Joint Pain and Swelling Reports several months of joint pain and swelling, particularly in the hands, feet, and legs, with worsening symptoms over the past month. Pain is accompanied by stiffness and difficulty performing daily tasks. Family history of arthritis, specifically in her mother, who has joint arthritis. Differential diagnosis includes osteoarthritis and rheumatoid arthritis. Blood work is planned to assess for signs of infection, anemia, and inflammation, and to differentiate between osteoarthritis and rheumatoid arthritis using tests such as CBC, chemistry panel, C-reactive protein, sed  rate, ANA, and rheumatoid factor. - Order CBC, chemistry panel, C-reactive protein, sed rate, ANA, and rheumatoid factor tests - Advise to take acetaminophen  for pain management - Instruct to exercise hands using stress balls to maintain range of motion - Advise to maintain adequate hydration - Consider referral to rheumatologist based on lab results  Vitamin D  Deficiency Vitamin D  deficiency is known but not specifically addressed during this visit.  General Health Maintenance Unsure about the last tetanus (Tdap) vaccination and has not had a recent COVID-19 booster since the initial vaccination in 2021. Had a Pap smear last year and a mammogram a few months ago. Screening for hepatitis C is recommended as part of routine health maintenance. - Check immunization records for Tdap vaccination - Advise to receive COVID-19 booster at pharmacy - Screen for hepatitis C during blood work   Scientist, forensic Communication: Reviewed plan of care with patient verbalized understanding   Labs/tests ordered:  - CBC with Differential/Platelet - CMP with eGFR([facility]) - TSH - Hgb A1C - Lipid panel - Hep C Antibody - C Reactive Protein  - ANA  - Rheumatoid Factor  - Sed rate   Next Appointment : Return in about 1 year (around 10/08/2024) for annual Physical examination.Fasting labs soon.   Spent 45 minutes of Face to face and non-face to face with patient  >50% time spent counseling; reviewing medical record; tests; labs; documentation and developing future plan of care.   Estil Heman, NP

## 2023-10-14 ENCOUNTER — Other Ambulatory Visit

## 2023-10-14 DIAGNOSIS — M25441 Effusion, right hand: Secondary | ICD-10-CM

## 2023-10-14 DIAGNOSIS — Z1329 Encounter for screening for other suspected endocrine disorder: Secondary | ICD-10-CM

## 2023-10-14 DIAGNOSIS — Z13220 Encounter for screening for lipoid disorders: Secondary | ICD-10-CM

## 2023-10-14 DIAGNOSIS — Z1159 Encounter for screening for other viral diseases: Secondary | ICD-10-CM

## 2023-10-14 DIAGNOSIS — M25442 Effusion, left hand: Secondary | ICD-10-CM

## 2023-10-14 DIAGNOSIS — G8929 Other chronic pain: Secondary | ICD-10-CM

## 2023-10-16 ENCOUNTER — Other Ambulatory Visit: Payer: Self-pay | Admitting: Adult Health

## 2023-10-16 ENCOUNTER — Ambulatory Visit: Payer: Self-pay | Admitting: Adult Health

## 2023-10-16 DIAGNOSIS — R768 Other specified abnormal immunological findings in serum: Secondary | ICD-10-CM

## 2023-10-16 LAB — CBC WITH DIFFERENTIAL/PLATELET
Absolute Lymphocytes: 1454 {cells}/uL (ref 850–3900)
Absolute Monocytes: 511 {cells}/uL (ref 200–950)
Basophils Absolute: 50 {cells}/uL (ref 0–200)
Basophils Relative: 0.7 %
Eosinophils Absolute: 122 {cells}/uL (ref 15–500)
Eosinophils Relative: 1.7 %
HCT: 43.9 % (ref 35.0–45.0)
Hemoglobin: 13.4 g/dL (ref 11.7–15.5)
MCH: 28.3 pg (ref 27.0–33.0)
MCHC: 30.5 g/dL — ABNORMAL LOW (ref 32.0–36.0)
MCV: 92.6 fL (ref 80.0–100.0)
MPV: 9 fL (ref 7.5–12.5)
Monocytes Relative: 7.1 %
Neutro Abs: 5062 {cells}/uL (ref 1500–7800)
Neutrophils Relative %: 70.3 %
Platelets: 281 10*3/uL (ref 140–400)
RBC: 4.74 10*6/uL (ref 3.80–5.10)
RDW: 12.1 % (ref 11.0–15.0)
Total Lymphocyte: 20.2 %
WBC: 7.2 10*3/uL (ref 3.8–10.8)

## 2023-10-16 LAB — COMPLETE METABOLIC PANEL WITHOUT GFR
AG Ratio: 1.3 (calc) (ref 1.0–2.5)
ALT: 33 U/L — ABNORMAL HIGH (ref 6–29)
AST: 20 U/L (ref 10–30)
Albumin: 3.9 g/dL (ref 3.6–5.1)
Alkaline phosphatase (APISO): 55 U/L (ref 31–125)
BUN: 13 mg/dL (ref 7–25)
CO2: 23 mmol/L (ref 20–32)
Calcium: 8.9 mg/dL (ref 8.6–10.2)
Chloride: 105 mmol/L (ref 98–110)
Creat: 0.59 mg/dL (ref 0.50–0.99)
Globulin: 3.1 g/dL (ref 1.9–3.7)
Glucose, Bld: 101 mg/dL — ABNORMAL HIGH (ref 65–99)
Potassium: 4.2 mmol/L (ref 3.5–5.3)
Sodium: 137 mmol/L (ref 135–146)
Total Bilirubin: 0.3 mg/dL (ref 0.2–1.2)
Total Protein: 7 g/dL (ref 6.1–8.1)

## 2023-10-16 LAB — ANA, IFA COMPREHENSIVE PANEL
Anti Nuclear Antibody (ANA): POSITIVE — AB
ENA SM Ab Ser-aCnc: <1 AI
SM/RNP: <1 AI
SSA (Ro) (ENA) Antibody, IgG: >8 AI — AB
SSB (La) (ENA) Antibody, IgG: <1 AI
Scleroderma (Scl-70) (ENA) Antibody, IgG: <1 AI
ds DNA Ab: <1 [IU]/mL

## 2023-10-16 LAB — LIPID PANEL
Cholesterol: 149 mg/dL (ref <200)
HDL: 64 mg/dL (ref >=50)
LDL Cholesterol (Calc): 69 mg/dL
Non-HDL Cholesterol (Calc): 85 mg/dL (ref <130)
Total CHOL/HDL Ratio: 2.3 (calc) (ref <5.0)
Triglycerides: 82 mg/dL (ref <150)

## 2023-10-16 LAB — C-REACTIVE PROTEIN: CRP: 6.2 mg/L (ref <8.0)

## 2023-10-16 LAB — RHEUMATOID FACTOR: Rheumatoid fact SerPl-aCnc: 86 [IU]/mL — ABNORMAL HIGH (ref <14)

## 2023-10-16 LAB — ANTI-NUCLEAR AB-TITER (ANA TITER)
ANA TITER: 1:40 {titer} — ABNORMAL HIGH
ANA Titer 1: 1:320 {titer} — ABNORMAL HIGH

## 2023-10-16 LAB — HEPATITIS C ANTIBODY: Hepatitis C Ab: NONREACTIVE

## 2023-10-16 LAB — TSH: TSH: 1.38 m[IU]/L

## 2023-10-16 LAB — SEDIMENTATION RATE: Sed Rate: 11 mm/h (ref 0–20)

## 2023-10-16 NOTE — Progress Notes (Signed)
-     Rheumatoid factor elevated, ordered referral to rheumatology  -   ALT slightly elevated -Electrolytes all normal

## 2023-10-16 NOTE — Progress Notes (Signed)
-     Lipid panel normal ANA positive-   and titer elevated, sent referral to rheumatology -CRP, TSH normal -   Hep C antibody negative

## 2023-11-18 ENCOUNTER — Ambulatory Visit: Payer: Self-pay | Admitting: Family Medicine

## 2023-11-25 ENCOUNTER — Ambulatory Visit: Payer: Self-pay

## 2023-11-25 NOTE — Telephone Encounter (Signed)
 FYI Only or Action Required?: FYI only for provider. Unable to get into Rheumatologist until next year.  Patient was last seen in primary care on 10/09/2023 by Ngetich, Roxan BROCKS, NP.  Called Nurse Triage reporting Pain.  Symptoms began several months ago.  Interventions attempted: OTC medications: ibuprofen .  Symptoms are: gradually worsening.  Triage Disposition: See PCP Within 2 Weeks  Patient/caregiver understands and will follow disposition?: Yes Reason for Disposition  Arm pain is a chronic symptom (recurrent or ongoing AND present > 4 weeks)  Answer Assessment - Initial Assessment Questions 1. ONSET: When did the pain start?     6 months  2. LOCATION: Where is the pain located?     Bilateral hands and arms  3. PAIN: How bad is the pain? (Scale 0-10; or none, mild, moderate, severe)     10/10 at worst  4. WORK OR EXERCISE: Has there been any recent work or exercise that involved this part of the body?     Works with her hands Marketing executive)  5. CAUSE: What do you think is causing the arm pain?     Arthritis  Protocols used: Arm Pain-A-AH Copied from CRM #[id number]. Topic: Clinical - Red Word Triage >> Nov 25, 2023 11:34 AM Jasmin G wrote: [facility] that prompted transfer to Nurse Triage: Pt was given a referral for rheumatology, but appt is until next year, she would like to see if there's anything else that can be done before that because she is experiencing severe joint pain both arms, including fingers and feet to the point where she can't work due to the pain

## 2023-11-25 NOTE — Telephone Encounter (Signed)
 Appointment scheduled 11/26/2023

## 2023-11-26 ENCOUNTER — Encounter: Payer: Self-pay | Admitting: Adult Health

## 2023-11-26 ENCOUNTER — Ambulatory Visit (INDEPENDENT_AMBULATORY_CARE_PROVIDER_SITE_OTHER): Admitting: Adult Health

## 2023-11-26 VITALS — BP 110/68 | HR 82 | Temp 97.2°F | Resp 18 | Ht 64.0 in | Wt 136.4 lb

## 2023-11-26 DIAGNOSIS — M2550 Pain in unspecified joint: Secondary | ICD-10-CM | POA: Diagnosis not present

## 2023-11-26 DIAGNOSIS — G8929 Other chronic pain: Secondary | ICD-10-CM

## 2023-11-26 DIAGNOSIS — R768 Other specified abnormal immunological findings in serum: Secondary | ICD-10-CM

## 2023-11-26 MED ORDER — PREDNISONE 20 MG PO TABS: ORAL_TABLET | ORAL | 0 refills | Status: AC

## 2023-11-26 NOTE — Progress Notes (Signed)
 [facility] clinic  Provider:  Jereld Serum DNP  Code Status:  Full Code  Goals of Care:     07/15/2018    6:11 PM  Advanced Directives  Does Patient Have a Medical Advance Directive? No   Would patient like information on creating a medical advance directive? No - Patient declined      Data saved with a previous flowsheet row definition     Chief Complaint  Patient presents with   Arm Pain     Chronic arm & hand pain    Discussed the use of AI scribe software for clinical note transcription with the patient, who gave verbal consent to proceed.  HPI: Patient is a 43 y.o. female seen today for an acute visit for wrist, elbow and ankle pain.  Discussed the use of AI scribe software for clinical note transcription with the patient, who gave verbal consent to proceed.  History of Present Illness   Cinnamon Godlewski is a 43 year old female with positive ANA and rheumatoid factor who presents with chronic joint pain.  She has been experiencing chronic pain in her left wrist and elbow, as well as both hands and feet, for the past six to seven months. The pain is persistent, severe, and often wakes her up at night, affecting her ability to perform daily activities such as cooking and washing dishes. The pain is particularly severe in her wrists, making it difficult to apply pressure or hold objects.  She has been taking ibuprofen  for pain management, which initially provided some relief but is no longer effective and causes stomach discomfort. She occasionally takes vitamin D , folic acid, and prenatal vitamins but does not use any other medications regularly.  Her past medical workup includes a positive ANA and rheumatoid factor. She has an upcoming appointment with a rheumatologist on January 9.  In her family history, her mother suffers from severe rheumatoid arthritis and is currently bedridden. 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  No depression but reports stress related to work. Difficulty sleeping due to pain, waking up every one to two hours. She uses heat pads and wrist braces at night to alleviate discomfort.        History reviewed. No pertinent past medical history.  Past Surgical History:  Procedure Laterality Date   CESAREAN SECTION N/A 05/26/2013   Procedure: Primary Cesarean Section Delivery Baby Boy @ 2129, Apgar 8/9;  Surgeon: Rosaline DELENA Luna, MD;  Location: [facility];  Service: Obstetrics;  Laterality: N/A;    No Known Allergies  Outpatient Encounter Medications as of 11/26/2023  Medication Sig   Cholecalciferol (VITAMIN D ) 2000 units CAPS Take 1 capsule (2,000 Units total) by mouth daily.   folic acid (FOLVITE) 800 MCG tablet Take 800 mcg by mouth daily.    predniSONE  (DELTASONE ) 20 MG tablet Take 3 tablets (60 mg total) by mouth daily with breakfast for 3 days, THEN 2 tablets (40 mg total) daily with breakfast for 3 days, THEN 1 tablet (20 mg total) daily with breakfast for 3 days.   Prenatal Vit-Fe Fumarate-FA (MULTIVITAMIN-PRENATAL) 27-0.8 MG TABS tablet Take 1 tablet by mouth daily at 12 noon.   No facility-administered encounter medications on file as of 11/26/2023.    Review of Systems:  Review of Systems  Constitutional:  Negative for appetite change, chills, fatigue and fever.  HENT:  Negative for congestion, hearing loss, rhinorrhea and  sore throat.   Eyes: Negative.   Respiratory:  Negative for cough, shortness of breath and wheezing.   Cardiovascular:  Negative for chest pain, palpitations and leg swelling.  Gastrointestinal:  Negative for abdominal pain, constipation, diarrhea, nausea and vomiting.  Genitourinary:  Negative for dysuria.  Musculoskeletal:  Positive for arthralgias. Negative for back pain and myalgias.  Skin:  Negative for color change, rash and wound.  Neurological:  Negative for dizziness, weakness and headaches.   Psychiatric/Behavioral:  Negative for behavioral problems. The patient is not nervous/anxious.     Health Maintenance  Topic Date Due   Hepatitis B Vaccines (1 of 3 - 19+ 3-dose series) Never done   HPV VACCINES (1 - 3-dose SCDM series) Never done   Cervical Cancer Screening (HPV/Pap Cotest)  Never done   COVID-19 Vaccine (3 - 2024-25 season) 12/23/2022   DTaP/Tdap/Td (2 - Tdap) 04/25/2023   INFLUENZA VACCINE  11/22/2023   Hepatitis C Screening  Completed   HIV Screening  Completed   Meningococcal B Vaccine  Aged Out    Physical Exam: Vitals:   11/26/23 1406  BP: 110/68  Pulse: 82  Resp: 18  Temp: (!) 97.2 F (36.2 C)  SpO2: 99%  Weight: 136 lb 6.4 oz (61.9 kg)  Height: 5' 4 (1.626 m)   Body mass index is 23.41 kg/m. Physical Exam Constitutional:      Appearance: Normal appearance.  HENT:     Head: Normocephalic and atraumatic.     Nose: Nose normal.     Mouth/Throat:     Mouth: Mucous membranes are moist.  Eyes:     Conjunctiva/sclera: Conjunctivae normal.  Cardiovascular:     Rate and Rhythm: Normal rate and regular rhythm.  Pulmonary:     Effort: Pulmonary effort is normal.     Breath sounds: Normal breath sounds.  Abdominal:     General: Bowel sounds are normal.     Palpations: Abdomen is soft.  Musculoskeletal:        General: Normal range of motion.     Cervical back: Normal range of motion.  Skin:    General: Skin is warm and dry.  Neurological:     General: No focal deficit present.     Mental Status: She is alert and oriented to person, place, and time.  Psychiatric:        Mood and Affect: Mood normal.        Behavior: Behavior normal.        Thought Content: Thought content normal.        Judgment: Judgment normal.     Labs reviewed: Basic Metabolic Panel: Recent Labs    10/14/23 0815  NA 137  K 4.2  CL 105  CO2 23  GLUCOSE 101*  BUN 13  CREATININE 0.59  CALCIUM 8.9  TSH 1.38   Liver Function Tests: Recent Labs     10/14/23 0815  AST 20  ALT 33*  BILITOT 0.3  PROT 7.0   No results for input(s): LIPASE, AMYLASE in the last 8760 hours. No results for input(s): AMMONIA in the last 8760 hours. CBC: Recent Labs    10/14/23 0815  WBC 7.2  NEUTROABS 5,062  HGB 13.4  HCT 43.9  MCV 92.6  PLT 281   Lipid Panel: Recent Labs    10/14/23 0815  CHOL 149  HDL 64  LDLCALC 69  TRIG 82  CHOLHDL 2.3   Lab Results  Component Value Date   HGBA1C 5.6 07/10/2016  Procedures since last visit: No results found.  Assessment/Plan   Chronic pain of multiple joints (Primary)  Positive ANA (antinuclear antibody) -  Chronic joint pain with positive ANA and rheumatoid factor indicates rheumatoid arthritis. Pain impacts daily activities and work. Ibuprofen  ineffective and caused GI discomfort. Awaiting rheumatology consultation. - Prescribed prednisone : 60 mg daily for 3 days, then 40 mg daily for 3 days, then 20 mg daily for 3 days. - Advised reduction of meat intake and increase in vegetable consumption. - Recommended heat therapy and wrist braces. - Encouraged regular exercise and stress reduction activities such as yoga. - Advised to avoid MSG and processed foods. - Instructed to contact rheumatologist for cancellation list.   - predniSONE  (DELTASONE ) 20 MG tablet; Take 3 tablets (60 mg total) by mouth daily with breakfast for 3 days, THEN 2 tablets (40 mg total) daily with breakfast for 3 days, THEN 1 tablet (20 mg total) daily with breakfast for 3 days.  Dispense: 18 tablet; Refill: 0    Labs/tests ordered:   None   Return if symptoms worsen or fail to improve.  Mi Balla Medina-Vargas, NP

## 2023-12-09 ENCOUNTER — Ambulatory Visit: Payer: Self-pay

## 2023-12-12 ENCOUNTER — Encounter: Payer: Self-pay | Admitting: *Deleted

## 2023-12-12 ENCOUNTER — Ambulatory Visit (INDEPENDENT_AMBULATORY_CARE_PROVIDER_SITE_OTHER): Payer: Self-pay | Admitting: Obstetrics and Gynecology

## 2023-12-12 ENCOUNTER — Other Ambulatory Visit: Payer: Self-pay

## 2023-12-12 VITALS — BP 104/65 | HR 72 | Ht 65.0 in | Wt 136.0 lb

## 2023-12-12 DIAGNOSIS — Z3201 Encounter for pregnancy test, result positive: Secondary | ICD-10-CM | POA: Diagnosis not present

## 2023-12-12 DIAGNOSIS — Z3200 Encounter for pregnancy test, result unknown: Secondary | ICD-10-CM

## 2023-12-12 LAB — POCT PREGNANCY, URINE: Preg Test, Ur: POSITIVE — AB

## 2023-12-12 NOTE — Progress Notes (Signed)
 Pt presents for urine pregnancy test which is positive. She reports sure LMP 10/27/23 which yields EDD 08/02/24, now [redacted]w[redacted]d. Pt is G3P1011 and reports that she has Rheumatoid Arthritis. She states one episode of light vaginal spotting. She has occasional mild abdominal menstrual-like cramping. Pt was advised to go to [facility] if she develops heavy vaginal bleeding or increased abdominal pain. She will begin taking prenatal vitamins and will schedule prenatal care @ [facility] location. Nidia Daring in to meet pt. Pt voiced understanding of all information and instructions given.

## 2023-12-15 ENCOUNTER — Encounter: Payer: Self-pay | Admitting: Obstetrics and Gynecology

## 2023-12-15 NOTE — Progress Notes (Signed)
  History:  [name] is a 43 y.o. G3P1001 who presents to clinic today for pregnancy confirmation. LMP 10/27/23. She has a history of rheumatoid arthritis, not taking meds  Reviewed 10/14/23 labs, rheumatoid factor elevate, referral was placed previously, visit with them in January.   Past Surgical History:  Procedure Laterality Date   CESAREAN SECTION N/A 05/26/2013   Procedure: Primary Cesarean Section Delivery Baby Boy @ 2129, Apgar 8/9;  Surgeon: Rosaline DELENA Luna, MD;  Location: [facility];  Service: Obstetrics;  Laterality: N/A;    The following portions of the patient's history were reviewed and updated as appropriate: allergies, current medications, past family history, past medical history, past social history, past surgical history and problem list.   Review of Systems:  Pertinent items noted in HPI and remainder of comprehensive ROS otherwise negative.  Objective:  Physical Exam BP 104/65   Pulse 72   Ht 5' 5 (1.651 m)   Wt 136 lb (61.7 kg)   LMP 10/27/2023 (Exact Date)   BMI 22.63 kg/m  Physical Exam Vitals and nursing note reviewed.  Constitutional:      Appearance: Normal appearance.  HENT:     Head: Normocephalic.  Pulmonary:     Effort: Pulmonary effort is normal.  Skin:    General: Skin is warm and dry.  Neurological:     General: No focal deficit present.     Mental Status: She is alert.  Psychiatric:        Mood and Affect: Mood normal.        Behavior: Behavior normal.    Labs and Imaging No results found for this or any previous visit (from the past 24 hours).  No results found.   Assessment & Plan:  1. Possible pregnancy (Primary) Congratulated and welcomed to practice! Discussed scheduling NOB intake and visit at [facility].  Not on meds currently for RA Continue PNV  Approximately 15 minutes of total time was spent with this patient on history taking, coordination of care, education and documentation.    Delores Nidia CROME, FNP

## 2023-12-24 ENCOUNTER — Other Ambulatory Visit: Payer: Self-pay

## 2023-12-24 ENCOUNTER — Ambulatory Visit: Admitting: *Deleted

## 2023-12-24 VITALS — BP 114/75 | HR 78 | Wt 138.1 lb

## 2023-12-24 DIAGNOSIS — O289 Unspecified abnormal findings on antenatal screening of mother: Secondary | ICD-10-CM | POA: Insufficient documentation

## 2023-12-24 DIAGNOSIS — Z8489 Family history of other specified conditions: Secondary | ICD-10-CM | POA: Insufficient documentation

## 2023-12-24 DIAGNOSIS — Z3481 Encounter for supervision of other normal pregnancy, first trimester: Secondary | ICD-10-CM | POA: Diagnosis not present

## 2023-12-24 DIAGNOSIS — O0990 Supervision of high risk pregnancy, unspecified, unspecified trimester: Secondary | ICD-10-CM

## 2023-12-24 DIAGNOSIS — Z3A08 8 weeks gestation of pregnancy: Secondary | ICD-10-CM

## 2023-12-24 DIAGNOSIS — L659 Nonscarring hair loss, unspecified: Secondary | ICD-10-CM | POA: Insufficient documentation

## 2023-12-24 NOTE — Progress Notes (Signed)
 New OB Intake  I connected with Sheyna Tolson  on 12/24/23 at  8:15 AM EDT by In Person Visit and verified that I am speaking with the correct person using two identifiers. Nurse is located at [facility] and pt is located at [place].  I discussed the limitations, risks, security and privacy concerns of performing an evaluation and management service by telephone and the availability of in person appointments. I also discussed with the patient that there may be a patient responsible charge related to this service. The patient expressed understanding and agreed to proceed.  I explained I am completing New OB Intake today. We discussed EDD of 08/02/24 based on LMP of 10/27/23. Pt is G3P1001. I reviewed her allergies, medications and Medical/Surgical/OB history.    Patient Active Problem List   Diagnosis Date Noted   Abnormal finding on antenatal screening of mother 12/24/2023   Alopecia 12/24/2023   History of miscarriage 09/14/2019   Memory change 09/14/2019   Vitamin D  deficiency 09/14/2019   Fatigue 09/14/2019   Paresthesia 09/14/2019   Chest pain 09/14/2019   Perforation of left tympanic membrane 07/12/2016   Impaired fasting blood sugar 07/12/2016   Headache syndrome 07/12/2016   Postoperative state 05/27/2013     Concerns addressed today  Delivery Plans Plans to deliver at [facility]. Discussed the nature of our practice with multiple providers including residents and students as well as female and female providers. Due to the size of the practice, the delivering provider may not be the same as those providing prenatal care.   Patient is not a candidate for water birth.  MyChart/Babyscripts MyChart access verified. I explained pt will have some visits in office and some virtually. Babyscripts instructions given and order placed. Patient verifies receipt of registration text/e-mail. Account successfully created and app downloaded. If patient is a candidate for Optimized scheduling, add to  sticky note.   Blood Pressure Cuff/Weight Scale Blood pressure cuff ordered for patient to pick-up from [employer]. Explained after first prenatal appt pt will check weekly and document in Babyscripts. Patient does not have weight scale; patient may purchase if they desire to track weight weekly in Babyscripts.  Anatomy US  Explained first scheduled US  will be around 19 weeks. Anatomy US  scheduled for TBD at TBD.   Is patient a candidate for Babyscripts Optimization? No, due to Risk Factors   First visit review I reviewed new OB appt with patient. Explained pt will be seen by Olam Golden, CNM at first visit. Discussed [place] genetic screening with patient. Requests Panorama and Horizon.. Routine prenatal labs OB Urine collected at today's visit. Initial OB labs deferred to New OB visit.   Last Pap No results found for: DIAGPAP  Rocky CHRISTELLA Ober, RN 12/24/2023  8:16 AM

## 2023-12-24 NOTE — Patient Instructions (Signed)
[facility] has a partnership with the [employer] to provide prenatal navigation for the most needed resources in our community. In order to see how we can help connect you to these resources we need consent to contact you. Please complete the very short consent using the link below:   English Link: [email or web address]  Spanish Link: [email or web address]  

## 2023-12-29 LAB — URINE CULTURE, OB REFLEX

## 2023-12-29 LAB — CULTURE, OB URINE

## 2023-12-30 ENCOUNTER — Ambulatory Visit: Payer: Self-pay | Admitting: Obstetrics and Gynecology

## 2023-12-30 DIAGNOSIS — O2340 Unspecified infection of urinary tract in pregnancy, unspecified trimester: Secondary | ICD-10-CM

## 2023-12-30 MED ORDER — AMOXICILLIN-POT CLAVULANATE 875-125 MG PO TABS: 1.0000 | ORAL_TABLET | Freq: Two times a day (BID) | ORAL | 0 refills | Status: AC

## 2024-01-17 ENCOUNTER — Other Ambulatory Visit: Payer: Self-pay | Admitting: *Deleted

## 2024-01-17 DIAGNOSIS — O0990 Supervision of high risk pregnancy, unspecified, unspecified trimester: Secondary | ICD-10-CM

## 2024-01-21 ENCOUNTER — Other Ambulatory Visit (HOSPITAL_COMMUNITY)
Admission: RE | Admit: 2024-01-21 | Discharge: 2024-01-21 | Disposition: A | Discharge status: Home / Self Care | Source: Ambulatory Visit | Attending: Advanced Practice Midwife | Admitting: Advanced Practice Midwife

## 2024-01-21 ENCOUNTER — Ambulatory Visit (INDEPENDENT_AMBULATORY_CARE_PROVIDER_SITE_OTHER): Admitting: Advanced Practice Midwife

## 2024-01-21 ENCOUNTER — Encounter: Payer: Self-pay | Admitting: Advanced Practice Midwife

## 2024-01-21 VITALS — BP 105/65 | HR 88 | Wt 139.0 lb

## 2024-01-21 DIAGNOSIS — O2341 Unspecified infection of urinary tract in pregnancy, first trimester: Secondary | ICD-10-CM | POA: Diagnosis not present

## 2024-01-21 DIAGNOSIS — O26851 Spotting complicating pregnancy, first trimester: Secondary | ICD-10-CM

## 2024-01-21 DIAGNOSIS — O09529 Supervision of elderly multigravida, unspecified trimester: Secondary | ICD-10-CM

## 2024-01-21 DIAGNOSIS — O09521 Supervision of elderly multigravida, first trimester: Secondary | ICD-10-CM

## 2024-01-21 DIAGNOSIS — O0990 Supervision of high risk pregnancy, unspecified, unspecified trimester: Secondary | ICD-10-CM | POA: Insufficient documentation

## 2024-01-21 DIAGNOSIS — Z98891 History of uterine scar from previous surgery: Secondary | ICD-10-CM

## 2024-01-21 DIAGNOSIS — Z3A12 12 weeks gestation of pregnancy: Secondary | ICD-10-CM

## 2024-01-21 NOTE — Progress Notes (Signed)
 Pt presents for NOB visit. Pt c/o vaginal bleeding when wiping. Requesting iron supplement   Last PAP unknown

## 2024-01-21 NOTE — Progress Notes (Signed)
 Subjective:   Christine Le is a 43 y.o. G3P1011 at [redacted]w[redacted]d by LMP c/w early US  being seen today for her first obstetrical visit.  Her obstetrical history is significant for advanced maternal age and has Postoperative state; Perforation of left tympanic membrane; Impaired fasting blood sugar; Headache syndrome; History of miscarriage; Memory change; Vitamin D  deficiency; Fatigue; Paresthesia; Chest pain; Abnormal finding on antenatal screening of mother; Alopecia; Supervision of high risk pregnancy, antepartum; and Family history of genetic disorder on their problem list..  Pregnancy history fully reviewed.  Patient reports mild nausea, occasional headaches.  HISTORY: OB History  Gravida Para Term Preterm AB Living  3 1 1  0 1 1  SAB IAB Ectopic Multiple Live Births  1 0 0 0 1    # Outcome Date GA Lbr Len/2nd Weight Sex Type Anes PTL Lv  3 Current           2 SAB 2020     SAB     1 Term 05/26/13 [redacted]w[redacted]d  8 lb 0.6 oz (3.645 kg) M CS-LTranv EPI  LIV     Name: Bryner,BOY Dasja     Apgar1: 8  Apgar5: 9   Past Medical History:  Diagnosis Date   Rheumatoid arthritis (HCC)    Past Surgical History:  Procedure Laterality Date   CESAREAN SECTION N/A 05/26/2013   Procedure: Primary Cesarean Section Delivery Baby Boy @ 2129, Apgar 8/9;  Surgeon: Rosaline DELENA Luna, MD;  Location: [facility];  Service: Obstetrics;  Laterality: N/A;   Family History  Problem Relation Age of Onset   Rheumatologic disease Mother    Hypertension Father    Stroke Maternal Grandfather    Down syndrome Brother    Cancer Neg Hx    Heart disease Neg Hx    Depression Neg Hx    Social History   Tobacco Use   Smoking status: Never   Smokeless tobacco: Never  Vaping Use   Vaping status: Never Used  Substance Use Topics   Alcohol use: Not Currently    Alcohol/week: 3.0 standard drinks of alcohol    Types: 3 Glasses of wine per week    Comment: occ   Drug use: Never   No Known Allergies Current Outpatient  Medications on File Prior to Visit  Medication Sig Dispense Refill   Cholecalciferol (VITAMIN D ) 2000 units CAPS Take 1 capsule (2,000 Units total) by mouth daily. 30 capsule 11   Prenatal Vit-Fe Fumarate-FA (MULTIVITAMIN-PRENATAL) 27-0.8 MG TABS tablet Take 1 tablet by mouth daily at 12 noon.     folic acid (FOLVITE) 800 MCG tablet Take 800 mcg by mouth daily.  (Patient not taking: Reported on 01/21/2024)     No current facility-administered medications on file prior to visit.   Exam   Vitals:   01/21/24 1340  BP: 105/65  Pulse: 88  Weight: 139 lb (63 kg)   Fetal Heart Rate (bpm): 171  VS reviewed, nursing note reviewed,  Constitutional: well developed, well nourished, no distress HEENT: normocephalic CV: normal rate Pulm/chest wall: normal effort Abdomen: soft Neuro: alert and oriented x 3 Skin: warm, dry Psych: affect normal     Assessment:   Pregnancy: G3P1011 Patient Active Problem List   Diagnosis Date Noted   Abnormal finding on antenatal screening of mother 12/24/2023   Alopecia 12/24/2023   Supervision of high risk pregnancy, antepartum 12/24/2023   Family history of genetic disorder 12/24/2023   History of miscarriage 09/14/2019   Memory change 09/14/2019  Vitamin D  deficiency 09/14/2019   Fatigue 09/14/2019   Paresthesia 09/14/2019   Chest pain 09/14/2019   Perforation of left tympanic membrane 07/12/2016   Impaired fasting blood sugar 07/12/2016   Headache syndrome 07/12/2016   Postoperative state 05/27/2013     Plan:  1. Supervision of high risk pregnancy, antepartum (Primary) --Anticipatory guidance about next visits/weeks of pregnancy given.   - CBC/D/Plt+RPR+Rh+ABO+RubIgG... - Cervicovaginal ancillary only( [place]) - HgB A1c - PANORAMA PRENATAL TEST - HORIZON Basic Panel - Comp Met (CMET)  2. Antepartum multigravida of advanced maternal age --NIPS testing ordered, antenatal testing in third trimester - Comp Met (CMET)  3. History  of cesarean section   4. Urinary tract infection in mother during first trimester of pregnancy --Pt did not begin treatment, repeat culture today  - Culture, OB Urine  5. Spotting affecting pregnancy in first trimester --Once weekly, especially when on her feet/ working a lot.  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  Bleeding precautions reviewed. Self swab today.     Initial labs drawn. Continue prenatal vitamins. Discussed and offered genetic screening options, including Quad screen/AFP, NIPS testing, and option to decline testing. Benefits/risks/alternatives reviewed. Pt aware that anatomy US  is form of genetic screening with lower accuracy in detecting trisomies than blood work.  Pt chooses genetic screening today. NIPS: ordered. Ultrasound discussed; fetal anatomic survey: ordered. Problem list reviewed and updated. The nature of [facility] with multiple MDs and other Advanced Practice Providers was explained to patient; also emphasized that residents, students are part of our team. Routine obstetric precautions reviewed. Return in about 4 weeks (around 02/18/2024) for [facility], Any provider.   Olam Boards, CNM 01/21/24 2:38 PM

## 2024-01-22 LAB — CBC/D/PLT+RPR+RH+ABO+RUBIGG...
Antibody Screen: NEGATIVE
Basophils Absolute: 0 x10E3/uL (ref 0.0–0.2)
Basos: 0 %
EOS (ABSOLUTE): 0.1 x10E3/uL (ref 0.0–0.4)
Eos: 1 %
HCV Ab: NONREACTIVE
HIV Screen 4th Generation wRfx: NONREACTIVE
Hematocrit: 40.1 % (ref 34.0–46.6)
Hemoglobin: 13 g/dL (ref 11.1–15.9)
Hepatitis B Surface Ag: NEGATIVE
Immature Grans (Abs): 0 x10E3/uL (ref 0.0–0.1)
Immature Granulocytes: 0 %
Lymphocytes Absolute: 1.3 x10E3/uL (ref 0.7–3.1)
Lymphs: 19 %
MCH: 28.6 pg (ref 26.6–33.0)
MCHC: 32.4 g/dL (ref 31.5–35.7)
MCV: 88 fL (ref 79–97)
Monocytes Absolute: 0.5 x10E3/uL (ref 0.1–0.9)
Monocytes: 7 %
Neutrophils Absolute: 5.2 x10E3/uL (ref 1.4–7.0)
Neutrophils: 73 %
Platelets: 288 x10E3/uL (ref 150–450)
RBC: 4.54 x10E6/uL (ref 3.77–5.28)
RDW: 13 % (ref 11.7–15.4)
RPR Ser Ql: NONREACTIVE
Rh Factor: POSITIVE
Rubella Antibodies, IGG: 21.1 {index} (ref >0.99)
WBC: 7.1 x10E3/uL (ref 3.4–10.8)

## 2024-01-22 LAB — COMPREHENSIVE METABOLIC PANEL WITH GFR
ALT: 67 IU/L — ABNORMAL HIGH (ref 0–32)
AST: 29 IU/L (ref 0–40)
Albumin: 3.8 g/dL — ABNORMAL LOW (ref 3.9–4.9)
Alkaline Phosphatase: 62 IU/L (ref 41–116)
BUN/Creatinine Ratio: 19 (ref 9–23)
BUN: 10 mg/dL (ref 6–24)
Bilirubin Total: <0.2 mg/dL (ref 0.0–1.2)
CO2: 20 mmol/L (ref 20–29)
Calcium: 8.8 mg/dL (ref 8.7–10.2)
Chloride: 102 mmol/L (ref 96–106)
Creatinine, Ser: 0.52 mg/dL — ABNORMAL LOW (ref 0.57–1.00)
Globulin, Total: 3.1 g/dL (ref 1.5–4.5)
Glucose: 83 mg/dL (ref 70–99)
Potassium: 4 mmol/L (ref 3.5–5.2)
Sodium: 136 mmol/L (ref 134–144)
Total Protein: 6.9 g/dL (ref 6.0–8.5)
eGFR: 120 mL/min/1.73 (ref >59)

## 2024-01-22 LAB — CERVICOVAGINAL ANCILLARY ONLY
Chlamydia: NEGATIVE
Comment: NEGATIVE
Comment: NEGATIVE
Comment: NORMAL
Neisseria Gonorrhea: NEGATIVE
Trichomonas: NEGATIVE

## 2024-01-22 LAB — HCV INTERPRETATION

## 2024-01-22 LAB — HEMOGLOBIN A1C
Est. average glucose Bld gHb Est-mCnc: 126 mg/dL
Hgb A1c MFr Bld: 6 % — ABNORMAL HIGH (ref 4.8–5.6)

## 2024-01-23 LAB — CULTURE, OB URINE

## 2024-01-23 LAB — URINE CULTURE, OB REFLEX: Organism ID, Bacteria: NO GROWTH

## 2024-01-26 LAB — PANORAMA PRENATAL TEST FULL PANEL:PANORAMA TEST PLUS 5 ADDITIONAL MICRODELETIONS: FETAL FRACTION: 10

## 2024-01-27 ENCOUNTER — Ambulatory Visit: Payer: Self-pay | Admitting: Advanced Practice Midwife

## 2024-01-28 LAB — HORIZON CUSTOM: REPORT SUMMARY: NEGATIVE

## 2024-02-05 ENCOUNTER — Telehealth: Payer: Self-pay

## 2024-02-05 NOTE — Telephone Encounter (Signed)
 Returned call, advised of results and advised schedulers will call for early gtt appt

## 2024-02-07 ENCOUNTER — Other Ambulatory Visit

## 2024-02-07 DIAGNOSIS — R7309 Other abnormal glucose: Secondary | ICD-10-CM

## 2024-02-07 DIAGNOSIS — O0990 Supervision of high risk pregnancy, unspecified, unspecified trimester: Secondary | ICD-10-CM

## 2024-02-07 DIAGNOSIS — Z3A14 14 weeks gestation of pregnancy: Secondary | ICD-10-CM

## 2024-02-08 LAB — GLUCOSE TOLERANCE, 2 HOURS W/ 1HR
Glucose, 1 hour: 181 mg/dL — ABNORMAL HIGH (ref 70–179)
Glucose, 2 hour: 151 mg/dL (ref 70–152)
Glucose, Fasting: 86 mg/dL (ref 70–91)

## 2024-02-11 ENCOUNTER — Ambulatory Visit: Payer: Self-pay | Admitting: Obstetrics and Gynecology

## 2024-02-12 ENCOUNTER — Other Ambulatory Visit: Payer: Self-pay

## 2024-02-12 DIAGNOSIS — O24419 Gestational diabetes mellitus in pregnancy, unspecified control: Secondary | ICD-10-CM

## 2024-02-12 MED ORDER — ACCU-CHEK GUIDE TEST VI STRP: ORAL_STRIP | 12 refills | Status: AC

## 2024-02-12 MED ORDER — ACCU-CHEK SOFTCLIX LANCETS MISC: 12 refills | Status: AC

## 2024-02-12 MED ORDER — ACCU-CHEK GUIDE W/DEVICE KIT: 1.0000 | PACK | Freq: Four times a day (QID) | 0 refills | Status: AC

## 2024-02-13 NOTE — Progress Notes (Unsigned)
 Patient was seen for Gestational Diabetes on 02/14/2024  Start time 0803 and End time 0903   Estimated due date: 08/02/2024; [redacted]w[redacted]d  Clinical: Medications:  Current Outpatient Medications:    Accu-Chek Softclix Lancets lancets, Check 4 times daily. Once in the morning before eating, and two hours after breakfast, lunch, and dinner, Disp: 100 each, Rfl: 12   Blood Glucose Monitoring Suppl (ACCU-CHEK GUIDE) w/Device KIT, 1 Device by Does not apply route 4 (four) times daily., Disp: 1 kit, Rfl: 0   glucose blood (ACCU-CHEK GUIDE TEST) test strip, Check 4 times daily. Once in the morning before eating, and two hours after breakfast, lunch, and dinner, Disp: 100 each, Rfl: 12   Cholecalciferol (VITAMIN D ) 2000 units CAPS, Take 1 capsule (2,000 Units total) by mouth daily., Disp: 30 capsule, Rfl: 11   folic acid (FOLVITE) 800 MCG tablet, Take 800 mcg by mouth daily.  (Patient not taking: Reported on 01/21/2024), Disp: , Rfl:    Prenatal Vit-Fe Fumarate-FA (MULTIVITAMIN-PRENATAL) 27-0.8 MG TABS tablet, Take 1 tablet by mouth daily at 12 noon., Disp: , Rfl:   Medical History:  Past Medical History:  Diagnosis Date   Rheumatoid arthritis (HCC)     Labs: OGTT fasting 86, 1 hour 181, 2 hour 151 on 02/07/2024 Lab Results  Component Value Date   HGBA1C 6.0 (H) 01/21/2024   Dietary and Lifestyle History: Pt presents today alone. Pt denies having a blood sugar meter and was encouraged to pick up from pharmacy. Pt reports working full time mostly walking. Pt reports she does the cooking and shopping. Pt reports previous diagnosis of prediabetes. Pt reports intake of sugary sweetened beverages 3-5 times weekly and a willingness to omit.  All Pt's questions were answered during this encounter.    Physical Activity: garden, walk at work Stress:  5 out of 10 / self care includes: meditate, think positive, talk to husband Sleep: on average hour nightly 6-7 hour  24 hr Recall:  First Meal: 1 banana, waffle,  whole milk, fresh spring roll  Snack:  persimmon Second meal: ~ 2/3 cup rice, bamboo, broccoli, cucumber, carrots, lettuce, sesame seeds, ginger, shrimp  Snack:  grapes or apple Third meal: ~ 2/3 cup rice, chicken soup made with bamboo, water Snack:  none Beverages:  water, whole milk, fresh orange juice, coconut water, regular soda   NUTRITION INTERVENTION  Nutrition education (E-1) on the following topics:   Initial Follow-up  [x]  []  Definition of Gestational Diabetes [x]  []  Why dietary management is important in controlling blood glucose [x]  []  Effects each nutrient has on blood glucose levels [x]  []  Simple carbohydrates vs complex carbohydrates [x]  []  Fluid intake [x]  []  Creating a balanced meal plan [x]  []  Carbohydrate counting  [x]  []  When to check blood glucose levels [x]  []  Proper blood glucose monitoring techniques [x]  []  Effect of stress and stress reduction techniques  [x]  []  Exercise effect on blood glucose levels, appropriate exercise during pregnancy [x]  []  Importance of limiting caffeine and abstaining from alcohol and smoking [x]  []  Medications used for blood sugar control during pregnancy [x]  []  Hypoglycemia and rule of 15 [x]  []  Postpartum self care  Pt has meter prescription was instructed to begin testing her blood sugar before breakfast and 2 hour after each meal daily.  CBG: 87 mg/dL, reported as fasting per Pt   Patient instructed to monitor glucose levels: QID FBS: 60 - <= 95 mg/dL; 2 hour: <= 879 mg/dL  Patient received handouts: Nutrition Diabetes and Pregnancy Carbohydrate Counting  List Blood glucose log Snack ideas for diabetes during pregnancy Plate Planner  Patient will be seen for follow-up: 03/26/2024 [facility]

## 2024-02-14 ENCOUNTER — Encounter: Attending: Obstetrics and Gynecology | Admitting: Dietician

## 2024-02-14 DIAGNOSIS — O24419 Gestational diabetes mellitus in pregnancy, unspecified control: Secondary | ICD-10-CM | POA: Diagnosis present

## 2024-02-19 ENCOUNTER — Ambulatory Visit (INDEPENDENT_AMBULATORY_CARE_PROVIDER_SITE_OTHER): Admitting: Obstetrics

## 2024-02-19 VITALS — BP 95/59 | HR 86 | Wt 140.0 lb

## 2024-02-19 DIAGNOSIS — O09529 Supervision of elderly multigravida, unspecified trimester: Secondary | ICD-10-CM | POA: Diagnosis not present

## 2024-02-19 DIAGNOSIS — O0990 Supervision of high risk pregnancy, unspecified, unspecified trimester: Secondary | ICD-10-CM | POA: Diagnosis not present

## 2024-02-19 DIAGNOSIS — Z8759 Personal history of other complications of pregnancy, childbirth and the puerperium: Secondary | ICD-10-CM

## 2024-02-19 DIAGNOSIS — O24410 Gestational diabetes mellitus in pregnancy, diet controlled: Secondary | ICD-10-CM | POA: Diagnosis not present

## 2024-02-19 NOTE — Progress Notes (Signed)
 Questions need for added calcium.

## 2024-02-19 NOTE — Progress Notes (Signed)
 Swelling in hands and feet. 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 Asking about added calcium in diet. States doing blood sugars at home.

## 2024-02-19 NOTE — Progress Notes (Signed)
 Subjective:  Christine Le is a 43 y.o. G3P1011 at [redacted]w[redacted]d being seen today for ongoing prenatal care.  She is currently monitored for the following issues for this high-risk pregnancy and has Postoperative state; Perforation of left tympanic membrane; Impaired fasting blood sugar; Headache syndrome; History of miscarriage; Memory change; Vitamin D  deficiency; Fatigue; Paresthesia; Chest pain; Abnormal finding on antenatal screening of mother; Alopecia; Supervision of high risk pregnancy, antepartum; and Family history of genetic disorder on their problem list.  Patient reports no complaints.   .  .   . Denies leaking of fluid.   The following portions of the patient's history were reviewed and updated as appropriate: allergies, current medications, past family history, past medical history, past social history, past surgical history and problem list. Problem list updated.  Objective:  There were no vitals filed for this visit.  Fetal Status:           General:  Alert, oriented and cooperative. Patient is in no acute distress.  Skin: Skin is warm and dry. No rash noted.   Cardiovascular: Normal heart rate noted  Respiratory: Normal respiratory effort, no problems with respiration noted  Abdomen: Soft, gravid, appropriate for gestational age.       Pelvic:  Cervical exam deferred        Extremities: Normal range of motion.     Mental Status: Normal mood and affect. Normal behavior. Normal judgment and thought content.   Urinalysis:      Assessment and Plan:  Pregnancy: G3P1011 at [redacted]w[redacted]d  1. Supervision of high risk pregnancy, antepartum (Primary)  2. Antepartum multigravida of advanced maternal age  32. Diet controlled gestational diabetes mellitus (GDM) in second trimester - good glucose control:  FBS = 60-90   and   2 hour PP = < 130  4. History of miscarriage  5.  Previous C/S - wants BTL.  ?? TOLAC.   Preterm labor symptoms and general obstetric precautions including but not  limited to vaginal bleeding, contractions, leaking of fluid and fetal movement were reviewed in detail with the patient. Please refer to After Visit Summary for other counseling recommendations.   Return in about 4 weeks (around 03/18/2024) for [facility].   Rudy Carlin LABOR, MD 02/19/2024

## 2024-02-21 ENCOUNTER — Ambulatory Visit: Payer: Self-pay | Admitting: Obstetrics

## 2024-02-21 LAB — COMPREHENSIVE METABOLIC PANEL WITH GFR
ALT: 98 IU/L — ABNORMAL HIGH (ref 0–32)
AST: 45 IU/L — ABNORMAL HIGH (ref 0–40)
Albumin: 4 g/dL (ref 3.9–4.9)
Alkaline Phosphatase: 77 IU/L (ref 41–116)
BUN/Creatinine Ratio: 14 (ref 9–23)
BUN: 8 mg/dL (ref 6–24)
Bilirubin Total: 0.3 mg/dL (ref 0.0–1.2)
CO2: 19 mmol/L — ABNORMAL LOW (ref 20–29)
Calcium: 9 mg/dL (ref 8.7–10.2)
Chloride: 101 mmol/L (ref 96–106)
Creatinine, Ser: 0.56 mg/dL — ABNORMAL LOW (ref 0.57–1.00)
Globulin, Total: 3.3 g/dL (ref 1.5–4.5)
Glucose: 81 mg/dL (ref 70–99)
Potassium: 3.9 mmol/L (ref 3.5–5.2)
Sodium: 137 mmol/L (ref 134–144)
Total Protein: 7.3 g/dL (ref 6.0–8.5)
eGFR: 118 mL/min/1.73 (ref >59)

## 2024-02-21 LAB — AFP, SERUM, OPEN SPINA BIFIDA
AFP MoM: 1.81
AFP Value: 62.2 ng/mL
Gest. Age on Collection Date: 16 wk
Maternal Age At EDD: 41.5 a
OSBR Risk 1 IN: 1248
Test Results:: NEGATIVE
Weight: 140 [lb_av]

## 2024-02-24 ENCOUNTER — Other Ambulatory Visit: Payer: Self-pay

## 2024-02-24 DIAGNOSIS — R7989 Other specified abnormal findings of blood chemistry: Secondary | ICD-10-CM

## 2024-02-26 DIAGNOSIS — O24410 Gestational diabetes mellitus in pregnancy, diet controlled: Secondary | ICD-10-CM | POA: Insufficient documentation

## 2024-02-26 DIAGNOSIS — O09529 Supervision of elderly multigravida, unspecified trimester: Secondary | ICD-10-CM | POA: Insufficient documentation

## 2024-02-26 DIAGNOSIS — O34219 Maternal care for unspecified type scar from previous cesarean delivery: Secondary | ICD-10-CM | POA: Insufficient documentation

## 2024-03-11 ENCOUNTER — Ambulatory Visit: Attending: Obstetrics and Gynecology | Admitting: Obstetrics

## 2024-03-11 ENCOUNTER — Ambulatory Visit (HOSPITAL_BASED_OUTPATIENT_CLINIC_OR_DEPARTMENT_OTHER)

## 2024-03-11 ENCOUNTER — Other Ambulatory Visit: Payer: Self-pay | Admitting: *Deleted

## 2024-03-11 VITALS — BP 112/57 | HR 84

## 2024-03-11 DIAGNOSIS — M059 Rheumatoid arthritis with rheumatoid factor, unspecified: Secondary | ICD-10-CM | POA: Diagnosis not present

## 2024-03-11 DIAGNOSIS — O34219 Maternal care for unspecified type scar from previous cesarean delivery: Secondary | ICD-10-CM | POA: Insufficient documentation

## 2024-03-11 DIAGNOSIS — O99891 Other specified diseases and conditions complicating pregnancy: Secondary | ICD-10-CM

## 2024-03-11 DIAGNOSIS — Z3A19 19 weeks gestation of pregnancy: Secondary | ICD-10-CM

## 2024-03-11 DIAGNOSIS — Z363 Encounter for antenatal screening for malformations: Secondary | ICD-10-CM | POA: Insufficient documentation

## 2024-03-11 DIAGNOSIS — O0990 Supervision of high risk pregnancy, unspecified, unspecified trimester: Secondary | ICD-10-CM | POA: Diagnosis not present

## 2024-03-11 DIAGNOSIS — O24410 Gestational diabetes mellitus in pregnancy, diet controlled: Secondary | ICD-10-CM | POA: Insufficient documentation

## 2024-03-11 DIAGNOSIS — M069 Rheumatoid arthritis, unspecified: Secondary | ICD-10-CM | POA: Insufficient documentation

## 2024-03-11 DIAGNOSIS — O322XX0 Maternal care for transverse and oblique lie, not applicable or unspecified: Secondary | ICD-10-CM | POA: Diagnosis not present

## 2024-03-11 DIAGNOSIS — Z8489 Family history of other specified conditions: Secondary | ICD-10-CM | POA: Insufficient documentation

## 2024-03-11 DIAGNOSIS — O09522 Supervision of elderly multigravida, second trimester: Secondary | ICD-10-CM

## 2024-03-11 DIAGNOSIS — M06042 Rheumatoid arthritis without rheumatoid factor, left hand: Secondary | ICD-10-CM

## 2024-03-11 DIAGNOSIS — R7401 Elevation of levels of liver transaminase levels: Secondary | ICD-10-CM | POA: Diagnosis not present

## 2024-03-11 DIAGNOSIS — O4440 Low lying placenta NOS or without hemorrhage, unspecified trimester: Secondary | ICD-10-CM

## 2024-03-11 DIAGNOSIS — M06041 Rheumatoid arthritis without rheumatoid factor, right hand: Secondary | ICD-10-CM

## 2024-03-11 NOTE — Progress Notes (Signed)
 MFM Consult Note  Christine Le is currently at 19w 3d.  She was seen for a detailed fetal anatomy scan due to advanced maternal age (43 years old), history of rheumatoid arthritis, and recently diagnosed diet-controlled gestational diabetes.    She was screened early for gestational diabetes as her hemoglobin A1c drawn earlier in her pregnancy was 6.0%.  She reports that her fingerstick values have mostly been within normal limits.  Her pregnancy has also been complicated by elevated liver function tests with an AST of 45 and ALT of 98.  She has screened negative for hepatitis B and C.  She had a cell free DNA test earlier in her pregnancy which indicated a low risk for trisomy 106, 11, and 13. A female fetus is predicted.   Sonographic findings Single intrauterine pregnancy at 19w 3d.  Fetal cardiac activity:  Observed and appears normal. Presentation: Transverse, head to maternal left. The views of the fetal anatomy were visualized today.  There were no obvious fetal anomalies noted. Fetal biometry shows the estimated fetal weight of 319 grams which measures at the 73rd percentile. Amniotic fluid volume: Within normal limits.  MVP: 5.36 cm. Placenta: Posterior.  The patient was informed that anomalies may be missed due to technical limitations. If the fetus is in a suboptimal position or maternal habitus is increased, visualization of the fetus in the maternal uterus may be impaired.  Advanced maternal age The increased risk of fetal aneuploidy due to advanced maternal age was discussed.  Due to advanced maternal age, the patient was offered and declined an amniocentesis today for definitive diagnosis of fetal aneuploidy.  She is comfortable with the low risk indicated by her cell free DNA test  Gestational diabetes  She was advised to continue to monitor her fingersticks 4 times daily (fasting and 2 hours after each meal).   She was advised that our goals for her fingerstick values are  fasting values of 90-95 or less and two-hour postprandial values of 120 or less.   Should the majority (greater than 50%) of her fingerstick results be above these values, she may have to be started on insulin or metformin to help her achieve better glycemic control.  The increased risk of polyhydramnios, fetal macrosomia, and preeclampsia associated with diabetes was also discussed.    Rheumatoid arthritis The patient reports that her symptoms have improved since the start of her pregnancy.   She was advised that many pregnant women report that their symptoms related to rheumatoid arthritis improve during their pregnancy. Should she require treatment for rheumatoid arthritis, she was advised that prednisone  is a steroid that may be taken during pregnancy for treatment if necessary.  Elevated liver function tests The patient should have a right upper quadrant ultrasound ordered to evaluate for gallstones as she reports that she has experienced right upper quadrant pain in the past. She has a GI consult pending.  Due to her underlying medical conditions, we will continue to follow her with monthly growth ultrasounds.    Weekly fetal testing should be started at around 34 weeks and continued until delivery.  Delivery for well-controlled diabetes in pregnancy is usually recommended at around 39 weeks.    However, delivery at 37 weeks may be considered should her glycemic control be poor.  A follow-up exam was scheduled in 5 weeks.    The patient stated that all of her questions were answered today.  A total of 45 minutes was spent counseling and coordinating the care for this  patient.  Greater than 50% of the time was spent in direct face-to-face contact.

## 2024-03-18 ENCOUNTER — Encounter: Admitting: Obstetrics and Gynecology

## 2024-03-18 NOTE — Progress Notes (Signed)
 Request [facility] for new referral 12/4   Patient was seen for Gestational Diabetes on 03/26/2024 Start time 0808 and End time 0839   Estimated due date: 08/02/2024; [redacted]w[redacted]d   Clinical: Medications:   Current Outpatient Medications:    Accu-Chek Softclix Lancets lancets, Check 4 times daily. Once in the morning before eating, and two hours after breakfast, lunch, and dinner, Disp: 100 each, Rfl: 12   Blood Glucose Monitoring Suppl (ACCU-CHEK GUIDE) w/Device KIT, 1 Device by Does not apply route 4 (four) times daily., Disp: 1 kit, Rfl: 0   Cholecalciferol (VITAMIN D ) 2000 units CAPS, Take 1 capsule (2,000 Units total) by mouth daily., Disp: 30 capsule, Rfl: 11   folic acid (FOLVITE) 800 MCG tablet, Take 800 mcg by mouth daily.  (Patient not taking: Reported on 01/21/2024), Disp: , Rfl:    glucose blood (ACCU-CHEK GUIDE TEST) test strip, Check 4 times daily. Once in the morning before eating, and two hours after breakfast, lunch, and dinner, Disp: 100 each, Rfl: 12   Prenatal Vit-Fe Fumarate-FA (MULTIVITAMIN-PRENATAL) 27-0.8 MG TABS tablet, Take 1 tablet by mouth daily at 12 noon., Disp: , Rfl:    Past Medical History: Past Medical History:  Diagnosis Date   Chest pain 09/14/2019   Fatigue 09/14/2019   Impaired fasting blood sugar 07/12/2016   Perforation of left tympanic membrane 07/12/2016   Postoperative state 05/27/2013   Rheumatoid arthritis (HCC)    Vitamin D  deficiency 09/14/2019    Labs: OGTT fasting 86, 1 hour 181, 2 hour 151 on 02/07/2024 Lab Results  Component Value Date   HGBA1C 6.0 (H) 01/21/2024     Dietary and Lifestyle History: Pt presents today for follow up alone with her blood sugar log sheet.  Pt reports working full time mostly walking. Pt reports she does the cooking and shopping. Pt reports intake of sugary sweetened beverages has been decreased to a couple times weekly. Pt reports she falls asleep before after dinner monitoring. Pt states she can perform 1 hour post  prandial check at dinner and will make a note on log sheet if checked at 1 hour.  Pt request refills for strips and was encouraged to request pick up from pharmacy. All Pt's questions were answered during this encounter.      Physical Activity: garden, walk at work Stress:  7 out of 10 / self care includes: meditate, think positive, talk to husband Sleep: on average hour nightly 6-7 hour     24 hr Recall:  First Meal: slice of cake, 1 banana, 1 cup of whole milk (117 mg/dL reported 2 hour post prandial) Snack: mixed nuts or boiled egg  Second meal: ~ noodles, beef, shrimp, broccoli, cilantro, water  (112 mg/dL, reported as 2 hour post prandial)  Snack:  grapes or banana Third meal:  noodles, lettuce, cucumber, pork water, whole milk  Snack:  none Beverages:  water, whole milk, juice, no sugar added coconut water    NUTRITION INTERVENTION  Nutrition education (E-1) on the following topics:    InitialFollow-up   [x]         []         Definition of Gestational Diabetes [x]         [x]         Why dietary management is important in controlling blood glucose [x]         [x]         Effects each nutrient has on blood glucose levels [x]         [x]   Simple carbohydrates vs complex carbohydrates [x]         [x]         Fluid intake [x]         []         Creating a balanced meal plan [x]         []         Carbohydrate counting  [x]         [x]         When to check blood glucose levels [x]         []         Proper blood glucose monitoring techniques [x]         []         Effect of stress and stress reduction techniques  [x]         []         Exercise effect on blood glucose levels, appropriate exercise during pregnancy [x]         []         Importance of limiting caffeine and abstaining from alcohol and smoking [x]         []         Medications used for blood sugar control during pregnancy [x]         [x]         Hypoglycemia and rule of 15 [x]         [x]         Postpartum self care   Pt has meter  was instructed to begin testing her blood sugar before breakfast and 2 hour after each meal daily.    Patient instructed to monitor glucose levels: QID FBS: 60 - <= 95 mg/dL; 2 hour: <= 879 mg/dL   Patient received handouts: Nutrition Diabetes and Pregnancy Carbohydrate Counting List Blood glucose log Snack ideas for diabetes during pregnancy Plate Planner   Patient will be seen for follow-up: 05/28/2024

## 2024-03-26 ENCOUNTER — Other Ambulatory Visit: Payer: Self-pay

## 2024-03-26 ENCOUNTER — Ambulatory Visit: Payer: Self-pay

## 2024-03-26 ENCOUNTER — Encounter: Attending: Obstetrics and Gynecology | Admitting: Dietician

## 2024-03-26 DIAGNOSIS — O09522 Supervision of elderly multigravida, second trimester: Secondary | ICD-10-CM

## 2024-03-26 DIAGNOSIS — M069 Rheumatoid arthritis, unspecified: Secondary | ICD-10-CM

## 2024-03-26 DIAGNOSIS — O24410 Gestational diabetes mellitus in pregnancy, diet controlled: Secondary | ICD-10-CM

## 2024-03-26 DIAGNOSIS — O4440 Low lying placenta NOS or without hemorrhage, unspecified trimester: Secondary | ICD-10-CM

## 2024-03-26 DIAGNOSIS — O24419 Gestational diabetes mellitus in pregnancy, unspecified control: Secondary | ICD-10-CM | POA: Diagnosis present

## 2024-04-17 ENCOUNTER — Ambulatory Visit

## 2024-04-17 ENCOUNTER — Ambulatory Visit: Attending: Maternal & Fetal Medicine | Admitting: Maternal & Fetal Medicine

## 2024-04-17 VITALS — BP 96/60

## 2024-04-17 DIAGNOSIS — M069 Rheumatoid arthritis, unspecified: Secondary | ICD-10-CM | POA: Diagnosis not present

## 2024-04-17 DIAGNOSIS — O99891 Other specified diseases and conditions complicating pregnancy: Secondary | ICD-10-CM

## 2024-04-17 DIAGNOSIS — O4402 Placenta previa specified as without hemorrhage, second trimester: Secondary | ICD-10-CM

## 2024-04-17 DIAGNOSIS — O24410 Gestational diabetes mellitus in pregnancy, diet controlled: Secondary | ICD-10-CM | POA: Diagnosis present

## 2024-04-17 DIAGNOSIS — O09522 Supervision of elderly multigravida, second trimester: Secondary | ICD-10-CM

## 2024-04-17 DIAGNOSIS — O4440 Low lying placenta NOS or without hemorrhage, unspecified trimester: Secondary | ICD-10-CM | POA: Diagnosis present

## 2024-04-17 DIAGNOSIS — Z362 Encounter for other antenatal screening follow-up: Secondary | ICD-10-CM | POA: Diagnosis not present

## 2024-04-17 DIAGNOSIS — O34219 Maternal care for unspecified type scar from previous cesarean delivery: Secondary | ICD-10-CM

## 2024-04-17 DIAGNOSIS — Z8489 Family history of other specified conditions: Secondary | ICD-10-CM | POA: Diagnosis not present

## 2024-04-17 DIAGNOSIS — O2692 Pregnancy related conditions, unspecified, second trimester: Secondary | ICD-10-CM | POA: Diagnosis not present

## 2024-04-17 DIAGNOSIS — O0990 Supervision of high risk pregnancy, unspecified, unspecified trimester: Secondary | ICD-10-CM

## 2024-04-17 DIAGNOSIS — O34211 Maternal care for low transverse scar from previous cesarean delivery: Secondary | ICD-10-CM | POA: Insufficient documentation

## 2024-04-17 DIAGNOSIS — Z3A24 24 weeks gestation of pregnancy: Secondary | ICD-10-CM | POA: Insufficient documentation

## 2024-04-17 NOTE — Progress Notes (Unsigned)
 "  Office Visit Note  Patient: Christine Le             Date of Birth: February 03, 1981           MRN: [id number]             PCP: Leonarda Roxan BROCKS, NP Referring: Phyllis Jereld BROCKS, NP Visit Date: 05/01/2024 Occupation: Data Unavailable  Subjective:  No chief complaint on file.   History of Present Illness: Christine Le is a 43 y.o. female ***     Activities of Daily Living:  Patient reports morning stiffness for *** {minute/hour:19697}.   Patient {ACTIONS;DENIES/REPORTS:21021675::Denies} nocturnal pain.  Difficulty dressing/grooming: {ACTIONS;DENIES/REPORTS:21021675::Denies} Difficulty climbing stairs: {ACTIONS;DENIES/REPORTS:21021675::Denies} Difficulty getting out of chair: {ACTIONS;DENIES/REPORTS:21021675::Denies} Difficulty using hands for taps, buttons, cutlery, and/or writing: {ACTIONS;DENIES/REPORTS:21021675::Denies}  No Rheumatology ROS completed.   PMFS History:  Patient Active Problem List   Diagnosis Date Noted   AMA (advanced maternal age) multigravida 35+ 02/26/2024   Pregnancy with history of cesarean section, antepartum 02/26/2024   Gestational diabetes, diet controlled 02/26/2024   Abnormal finding on antenatal screening of mother 12/24/2023   Alopecia 12/24/2023   Supervision of high risk pregnancy, antepartum 12/24/2023   Family history of genetic disorder 12/24/2023   History of miscarriage 09/14/2019   Memory change 09/14/2019   Paresthesia 09/14/2019   Headache syndrome 07/12/2016    Past Medical History:  Diagnosis Date   Chest pain 09/14/2019   Fatigue 09/14/2019   Impaired fasting blood sugar 07/12/2016   Perforation of left tympanic membrane 07/12/2016   Postoperative state 05/27/2013   Rheumatoid arthritis (HCC)    Vitamin D  deficiency 09/14/2019    Family History  Problem Relation Age of Onset   Rheumatologic disease Mother    Hypertension Father    Stroke Maternal Grandfather    Down syndrome Brother    Cancer Neg Hx     Heart disease Neg Hx    Depression Neg Hx    Past Surgical History:  Procedure Laterality Date   CESAREAN SECTION N/A 05/26/2013   Procedure: Primary Cesarean Section Delivery Baby Boy @ 2129, Apgar 8/9;  Surgeon: Rosaline DELENA Luna, MD;  Location: [facility];  Service: Obstetrics;  Laterality: N/A;   Social History[1] Social History   Social History Narrative   Married.  Has 3yo son.  Walks a lot.   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  06/2016.     Immunization History  Administered Date(s) Administered   Moderna Sars-Covid-2 Vaccination 08/16/2019, 09/13/2019   Td 04/24/2013     Objective: Vital Signs: LMP 10/27/2023 (Exact Date)    Physical Exam   Musculoskeletal Exam: ***  CDAI Exam: CDAI Score: -- Patient Global: --; Provider Global: -- Swollen: --; Tender: -- Joint Exam 05/01/2024   No joint exam has been documented for this visit   There is currently no information documented on the homunculus. Go to the Rheumatology activity and complete the homunculus joint exam.  Investigation: No additional findings.  Imaging: US  MFM OB FOLLOW UP Result Date: 04/17/2024 ----------------------------------------------------------------------  OBSTETRICS REPORT                    (Corrected Final 04/17/2024 09:04 am) ---------------------------------------------------------------------- Patient Info  ID #:       [id number]                          D.O.B.:  1980-10-03 (43 yrs)(F)  Name:       Christine  Le                      Visit Date: 04/17/2024 08:08 am ---------------------------------------------------------------------- Performed By  Attending:        Delora Smaller DO       Ref. Address:     [facility]  Performed By:     Meade Parker       [facility]  Referred By:      Rollo Bring MD ---------------------------------------------------------------------- Orders  #  Description                           Code        Ordered By  1  US  MFM OB FOLLOW UP                   23183.98    BABARA KEYS ----------------------------------------------------------------------  #  Order #                     Accession #                Episode #  1  [id number]                   [id number]                 [id number] ---------------------------------------------------------------------- Indications  [redacted] weeks gestation of pregnancy                Z3A.40  Advanced maternal age multigravida 58+,        O23.522  second trimester  Gestational diabetes in pregnancy, diet        O24.410  controlled  Medical complication of pregnancy (maternal    O26.90  rheumatoid arthritis)  Previous cesarean delivery x1                  O34.219  Family history of genetic disorder (Pt's       Z55.89  brother - T21)  Low risk NIPS female - Neg AFP/Horizon  Encounter for other antenatal screening        Z36.2  follow-up  Placenta previa specified as without           O44.02  hemorrhage, second trimester ---------------------------------------------------------------------- Fetal Evaluation  Num Of Fetuses:  1  Fetal Heart Rate(bpm):  150  Cardiac Activity:       Observed  Presentation:           Breech  Placenta:               Posterior Fundal  P. Cord Insertion:      Previously seen  Amniotic Fluid  AFI FV:      Within normal limits                              Largest Pocket(cm)                              4.45  Comment:    Placental edge appears to be 3.0 cm from the internal os ---------------------------------------------------------------------- Biometry  BPD:      60.2  mm     G. Age:  24w 4d         35  %    CI:           68   %    70 - 86                                                          FL/HC:      18.7   %    18.7 - 20.3  HC:      233.5  mm     G. Age:  25w 3d         55  %     HC/AC:      1.14        1.04 - 1.22  AC:      204.2  mm     G. Age:  25w 0d         51  %    FL/BPD:     72.6   %    71 - 87  FL:       43.7  mm     G. Age:  24w 2d         26  %    FL/AC:      21.4   %    20 - 24  LV:        6.9  mm  Est. FW:     734  gm    1 lb 10 oz      43  % ---------------------------------------------------------------------- OB History  Gravidity:    3         Term:   1         SAB:   1  Living:       1 ---------------------------------------------------------------------- Gestational Age  LMP:           24w 5d        Date:  10/27/23                   EDD:   08/02/24  U/S Today:     24w 6d  EDD:   08/01/24  Best:          24w 5d     Det. By:  LMP  (10/27/23)          EDD:   08/02/24 ---------------------------------------------------------------------- Anatomy  Cavum:                 Appears normal         Stomach:                Appears normal, left                                                                        sided  Ventricles:            Appears normal         Kidneys:                Previously seen  Heart:                 Appears normal         Bladder:                Appears normal                         (4CH, axis, and                         situs)  Diaphragm:             Previously seen ---------------------------------------------------------------------- Cervix Uterus Adnexa  Cervix  Length:            3.6  cm.  Normal appearance by transabdominal scan  Uterus  No abnormality visualized.  Right Ovary  Not visualized.  Left Ovary  Not visualized.  Cul De Sac  No free fluid seen.  Adnexa  No abnormality visualized ---------------------------------------------------------------------- Comments  Sonographic findings  Single intrauterine pregnancy at 24w 5d.  Fetal cardiac activity:  Observed and appears normal.  Presentation: Breech.  Interval fetal anatomy appears normal.  Fetal biometry shows the estimated fetal weight of 1 lb 10 oz,   734g (43%).  Amniotic fluid volume: Within normal limits. MVP: 4.45 cm.  Placenta: Posterior Fundal.  There are limitations of prenatal ultrasound such as the  inability to detect certain abnormalities due to poor  visualization. Various factors such as fetal position,  gestational age and maternal body habitus may increase the  difficulty in visualizing the fetal anatomy.  Recommendations  - See Epic note for assessment and plan of care. Any  referring office that does not utilize Epic will recieve a copy of  today's consult note via fax. Please contact our office with  any concerns. ----------------------------------------------------------------------                       Delora Smaller, DO Electronically Signed Corrected Final Report  04/17/2024 09:04 am ----------------------------------------------------------------------    Recent Labs: Lab Results  Component Value Date   WBC 7.1 01/21/2024   HGB 13.0 01/21/2024   PLT 288 01/21/2024   NA 137 02/19/2024   K 3.9 02/19/2024   CL 101 02/19/2024  CO2 19 (L) 02/19/2024   GLUCOSE 81 02/19/2024   BUN 8 02/19/2024   CREATININE 0.56 (L) 02/19/2024   BILITOT 0.3 02/19/2024   ALKPHOS 77 02/19/2024   AST 45 (H) 02/19/2024   ALT 98 (H) 02/19/2024   PROT 7.3 02/19/2024   ALBUMIN 4.0 02/19/2024   CALCIUM 9.0 02/19/2024   GFRAA >60 08/27/2019   01/21/2024 hemoglobin A1c 6.1 10/14/2023 ANA 1: 320 NS, 1: 40 NH, SSA> 8.0, (dsDNA, SCL 70, Smith, SSB) negative, RF 86 hepatitis C nonreactive, lipid panel normal, TSH normal, sed rate 11, CRP 6.2  12/15/2019 RPR negative Speciality Comments: No specialty comments available.  Procedures:  No procedures performed Allergies: Patient has no known allergies.   Assessment / Plan:     Visit Diagnoses: Positive ANA (antinuclear antibody) - Positive ANA, positive SSA  Rheumatoid factor positive  History of miscarriage  Alopecia  Headache syndrome  Paresthesia  Memory change  Orders: No orders  of the defined types were placed in this encounter.  No orders of the defined types were placed in this encounter.   Face-to-face time spent with patient was *** minutes. Greater than 50% of time was spent in counseling and coordination of care.  Follow-Up Instructions: No follow-ups on file.   Maya Nash, MD  Note - This record has been created using Animal nutritionist.  Chart creation errors have been sought, but may not always  have been located. Such creation errors do not reflect on  the standard of medical care.    [1]  Social History Tobacco Use   Smoking status: Never   Smokeless tobacco: Never  Vaping Use   Vaping status: Never Used  Substance Use Topics   Alcohol use: Not Currently    Alcohol/week: 3.0 standard drinks of alcohol    Types: 3 Glasses of wine per week    Comment: occ   Drug use: Never   "

## 2024-04-17 NOTE — Progress Notes (Signed)
 Patient reports that she checks blood glucose at home and readings are as follows: Fasting levels range from 85 to 113, and two hour post-meal reading ranges from 80.to 140.

## 2024-04-17 NOTE — Progress Notes (Signed)
 "  Patient information  Patient Name: Christine Le  Patient MRN:   [id number]  Referring practice: [facility] Referring Provider: [facility]  Problem List   Patient Active Problem List   Diagnosis Date Noted   AMA (advanced maternal age) multigravida 35+ 02/26/2024   Pregnancy with history of cesarean section, antepartum 02/26/2024   Gestational diabetes, diet controlled 02/26/2024   Abnormal finding on antenatal screening of mother 12/24/2023   Alopecia 12/24/2023   Supervision of high risk pregnancy, antepartum 12/24/2023   Family history of genetic disorder 12/24/2023   History of miscarriage 09/14/2019   Memory change 09/14/2019   Paresthesia 09/14/2019   Headache syndrome 07/12/2016    Maternal Fetal medicine Consult  Christine Le is a 43 y.o. G3P1011 at [redacted]w[redacted]d here for ultrasound and consultation. Staley Stuck is doing well today with no acute concerns. Today we focused on the following:   The patient presents for a growth ultrasound due to advanced maternal age over 40 years, prior cesarean delivery, and diet-controlled gestational diabetes. She reports that approximately half of her postprandial blood glucose values and the majority of her fasting values are at goal, though she notes occasional outliers. We discussed the target of having at least 75% of blood glucose values within goal range and the importance of bringing her glucose log to her OB provider for ongoing review. We reviewed that if diet and exercise alone are insufficient to meet glycemic targets, medication therapy should be initiated, with insulin as the first-line agent followed by metformin. Todays ultrasound demonstrates an estimated fetal weight at the 43rd percentile, which is within the normal range. The patient denies vaginal bleeding, and a previously noted placenta previa has resolved. We discussed the plan for repeat growth assessment in four weeks, continued dietary discretion, and maintaining physical  activity with at least 30 minutes of walking daily. Given her age, antenatal testing will begin around [redacted] weeks gestation.  Sonographic findings Single intrauterine pregnancy at 24w 5d.  Fetal cardiac activity:  Observed and appears normal. Presentation: Breech. Interval fetal anatomy appears normal. Fetal biometry shows the estimated fetal weight of 1 lb 10 oz,  734g (43%). Amniotic fluid volume: Within normal limits. MVP: 4.45 cm. Placenta: Posterior Fundal.  There are limitations of prenatal ultrasound such as the inability to detect certain abnormalities due to poor visualization. Various factors such as fetal position, gestational age and maternal body habitus may increase the difficulty in visualizing the fetal anatomy.    RECOMMENDATIONS -Continue diet-controlled gestational diabetes management with goal of at least 75% of blood glucose values at target -Bring glucose log to primary OB provider for ongoing review and management -Initiate medical therapy if glycemic targets are not met with diet and exercise, with insulin as first-line followed by metformin -Continue dietary discretion and maintain at least 30 minutes of daily walking -Repeat growth ultrasound in four weeks -Begin antenatal testing at approximately [redacted] weeks gestation given advanced maternal age -No restrictions related to prior placenta previa, which has resolved; bleeding precautions reviewed  The patient had time to ask questions that were answered to her satisfaction.  She verbalized understanding and agrees to proceed with the plan above.   I spent 30 minutes reviewing the patients chart, including labs and images as well as counseling the patient about her medical conditions. Greater than 50% of the time was spent in direct face-to-face patient counseling.  Delora Smaller  MFM, [facility]   04/17/2024  8:58 AM   Review of Systems:  A review of systems was performed and was negative except per HPI   Vitals  and Physical Exam    04/17/2024    7:30 AM 03/11/2024    9:54 AM 02/19/2024    8:29 AM  Vitals with BMI  Weight   140 lbs  Systolic 96 112 95  Diastolic 60 57 59  Pulse  84 86    Sitting comfortably on the sonogram table Nonlabored breathing Normal rate and rhythm Abdomen is nontender  Past pregnancies OB History  Gravida Para Term Preterm AB Living  3 1 1  1 1   SAB IAB Ectopic Multiple Live Births  1    1    # Outcome Date GA Lbr Len/2nd Weight Sex Type Anes PTL Lv  3 Current           2 SAB 2020     SAB     1 Term 05/26/13 [redacted]w[redacted]d  8 lb 0.6 oz (3.645 kg) M CS-LTranv EPI  LIV     Future Appointments  Date Time Provider Department Center  05/01/2024  8:20 AM Dolphus Reiter, MD [facility] None  05/28/2024  8:15 AM [facility]  06/02/2024  8:00 AM Dolphus Reiter, MD [facility] None  10/12/2024  9:00 AM Ngetich, Roxan BROCKS, NP [facility] [address]      "

## 2024-05-01 ENCOUNTER — Encounter: Payer: Self-pay | Admitting: Rheumatology

## 2024-05-01 DIAGNOSIS — G4489 Other headache syndrome: Secondary | ICD-10-CM

## 2024-05-01 DIAGNOSIS — R413 Other amnesia: Secondary | ICD-10-CM

## 2024-05-01 DIAGNOSIS — R202 Paresthesia of skin: Secondary | ICD-10-CM

## 2024-05-01 DIAGNOSIS — L659 Nonscarring hair loss, unspecified: Secondary | ICD-10-CM

## 2024-05-01 DIAGNOSIS — Z8759 Personal history of other complications of pregnancy, childbirth and the puerperium: Secondary | ICD-10-CM

## 2024-05-01 DIAGNOSIS — R7689 Other specified abnormal immunological findings in serum: Secondary | ICD-10-CM

## 2024-05-05 ENCOUNTER — Encounter: Payer: Self-pay | Admitting: Obstetrics and Gynecology

## 2024-05-05 ENCOUNTER — Ambulatory Visit: Payer: Self-pay | Admitting: Obstetrics and Gynecology

## 2024-05-05 VITALS — BP 108/69 | HR 91 | Wt 154.4 lb

## 2024-05-05 DIAGNOSIS — O34219 Maternal care for unspecified type scar from previous cesarean delivery: Secondary | ICD-10-CM | POA: Diagnosis not present

## 2024-05-05 DIAGNOSIS — O0992 Supervision of high risk pregnancy, unspecified, second trimester: Secondary | ICD-10-CM | POA: Diagnosis not present

## 2024-05-05 DIAGNOSIS — O289 Unspecified abnormal findings on antenatal screening of mother: Secondary | ICD-10-CM

## 2024-05-05 DIAGNOSIS — O09522 Supervision of elderly multigravida, second trimester: Secondary | ICD-10-CM | POA: Diagnosis not present

## 2024-05-05 DIAGNOSIS — Z3009 Encounter for other general counseling and advice on contraception: Secondary | ICD-10-CM | POA: Diagnosis not present

## 2024-05-05 DIAGNOSIS — O4400 Placenta previa specified as without hemorrhage, unspecified trimester: Secondary | ICD-10-CM | POA: Insufficient documentation

## 2024-05-05 DIAGNOSIS — Z3A27 27 weeks gestation of pregnancy: Secondary | ICD-10-CM

## 2024-05-05 DIAGNOSIS — O0990 Supervision of high risk pregnancy, unspecified, unspecified trimester: Secondary | ICD-10-CM

## 2024-05-05 DIAGNOSIS — O24410 Gestational diabetes mellitus in pregnancy, diet controlled: Secondary | ICD-10-CM | POA: Diagnosis not present

## 2024-05-05 DIAGNOSIS — O4402 Placenta previa specified as without hemorrhage, second trimester: Secondary | ICD-10-CM

## 2024-05-05 DIAGNOSIS — O09523 Supervision of elderly multigravida, third trimester: Secondary | ICD-10-CM

## 2024-05-05 NOTE — Progress Notes (Addendum)
 "  PRENATAL VISIT NOTE  Subjective:  Christine Le is a 44 y.o. G3P1011 at [redacted]w[redacted]d being seen today for ongoing prenatal care.  She is currently monitored for the following issues for this high-risk pregnancy and has Headache syndrome; History of miscarriage; Memory change; Paresthesia; Abnormal finding on antenatal screening of mother; Alopecia; Supervision of high risk pregnancy, antepartum; Family history of genetic disorder; AMA (advanced maternal age) multigravida 35+; Pregnancy with history of cesarean section, antepartum; Gestational diabetes, diet controlled; Unwanted fertility; and Placenta previa on their problem list.  Patient reports fatigue. Reports her sugars have been mostly under control but occasionally 2 hr pp will be up to 130/140. Contractions: Irritability. Vag. Bleeding: None.  Movement: Present. Denies leaking of fluid.   The following portions of the patient's history were reviewed and updated as appropriate: allergies, current medications, past family history, past medical history, past social history, past surgical history and problem list.   Objective:   Vitals:   05/05/24 1014  BP: 108/69  Pulse: 91  Weight: 154 lb 6.4 oz (70 kg)    Fetal Status:      Movement: Present    General: Alert, oriented and cooperative. Patient is in no acute distress.  Skin: Skin is warm and dry. No rash noted.   Cardiovascular: Normal heart rate noted  Respiratory: Normal respiratory effort, no problems with respiration noted  Abdomen: Soft, gravid, appropriate for gestational age.  Pain/Pressure: Present     Pelvic: Cervical exam deferred        Extremities: Normal range of motion.  Edema: Trace  Mental Status: Normal mood and affect. Normal behavior. Normal judgment and thought content.      02/14/2024    8:08 AM 01/21/2024    1:49 PM 12/24/2023    8:47 AM  Depression screen PHQ 2/9  Decreased Interest 0 0 0  Down, Depressed, Hopeless 0 0 1  PHQ - 2 Score 0 0 1  Altered  sleeping  0 0  Tired, decreased energy  0 0  Change in appetite  0 0  Feeling bad or failure about yourself   0 1  Trouble concentrating  0 0  Moving slowly or fidgety/restless  0 0  Suicidal thoughts  0 0  PHQ-9 Score  0  2      Data saved with a previous flowsheet row definition        01/21/2024    1:50 PM 12/24/2023    8:49 AM  GAD 7 : Generalized Anxiety Score  Nervous, Anxious, on Edge 0 0  Control/stop worrying 0 0  Worry too much - different things 0 0  Trouble relaxing 0 0  Restless 0 0  Easily annoyed or irritable 0 0  Afraid - awful might happen 0 0  Total GAD 7 Score 0 0    Assessment and Plan:  Pregnancy: G3P1011 at [redacted]w[redacted]d  1. Diet controlled gestational diabetes mellitus (GDM) in third trimester (Primary) Reports most of the time her sugars are within range Cont diet control, reviewed need for medication if >10% sugars are out of range   2. Supervision of high risk pregnancy, antepartum 3rd trim labs today Counseled regarding risks/benefits of Tdap vaccine, patient declines vaccine today, will consider.  3. Abnormal finding on antenatal screening of mother  4. Multigravida of advanced maternal age in third trimester Needs antenatal testing starting 32 weeks Ordered today  5. Pregnancy with history of cesarean section, antepartum Reviewed risks/benefits of TOLAC versus RCS in detail. Patient  counseled regarding potential vaginal delivery, chance of success, future implications, possible uterine rupture and need for urgent/emergent repeat cesarean. Counseled regarding potential need for repeat c-section for reasons unrelated to first c-section. Counseled regarding scheduled repeat cesarean including risks of bleeding, infection, damage to surrounding tissue, abnormal placentation, implications for future pregnancies. All questions answered.  Patient desires TOLAC, consent signed 05/05/2024.  6. Unwanted fertility Signed state BTL papers today Only wants BTL if  she is having c-section  7. [redacted] weeks gestation of pregnancy   Preterm labor symptoms and general obstetric precautions including but not limited to vaginal bleeding, contractions, leaking of fluid and fetal movement were reviewed in detail with the patient. Please refer to After Visit Summary for other counseling recommendations.   Return in about 2 weeks (around 05/19/2024) for high OB.  Future Appointments  Date Time Provider Department Center  05/20/2024  8:35 AM Delores Nidia CROME, FNP [facility] None  05/28/2024  8:15 AM [facility]  10/12/2024  9:00 AM Ngetich, Roxan BROCKS, NP [facility] [address]  11/03/2024  8:20 AM Dolphus Reiter, MD [facility] None  12/01/2024  8:00 AM Dolphus Reiter, MD [facility] None    Burnard CHRISTELLA Moats, MD  "

## 2024-05-05 NOTE — Addendum Note (Signed)
 Addended by: NICHOLAUS AWKWARD on: 05/05/2024 11:03 AM   Modules accepted: Orders

## 2024-05-05 NOTE — Progress Notes (Signed)
 Pt presents for ROB visit. No blood sugar log. Pt states she is out of strips. Declines Tdap today

## 2024-05-20 ENCOUNTER — Ambulatory Visit: Attending: Obstetrics | Admitting: Obstetrics

## 2024-05-20 ENCOUNTER — Other Ambulatory Visit: Payer: Self-pay | Admitting: *Deleted

## 2024-05-20 ENCOUNTER — Encounter: Payer: Self-pay | Admitting: Physician Assistant

## 2024-05-20 ENCOUNTER — Encounter: Payer: Self-pay | Admitting: Obstetrics and Gynecology

## 2024-05-20 ENCOUNTER — Ambulatory Visit

## 2024-05-20 ENCOUNTER — Ambulatory Visit: Admitting: Obstetrics and Gynecology

## 2024-05-20 VITALS — BP 116/72 | HR 82 | Wt 157.8 lb

## 2024-05-20 DIAGNOSIS — Z362 Encounter for other antenatal screening follow-up: Secondary | ICD-10-CM | POA: Insufficient documentation

## 2024-05-20 DIAGNOSIS — M069 Rheumatoid arthritis, unspecified: Secondary | ICD-10-CM | POA: Insufficient documentation

## 2024-05-20 DIAGNOSIS — O09523 Supervision of elderly multigravida, third trimester: Secondary | ICD-10-CM

## 2024-05-20 DIAGNOSIS — O2693 Pregnancy related conditions, unspecified, third trimester: Secondary | ICD-10-CM | POA: Insufficient documentation

## 2024-05-20 DIAGNOSIS — O0993 Supervision of high risk pregnancy, unspecified, third trimester: Secondary | ICD-10-CM

## 2024-05-20 DIAGNOSIS — O34219 Maternal care for unspecified type scar from previous cesarean delivery: Secondary | ICD-10-CM | POA: Insufficient documentation

## 2024-05-20 DIAGNOSIS — Z3A29 29 weeks gestation of pregnancy: Secondary | ICD-10-CM | POA: Diagnosis not present

## 2024-05-20 DIAGNOSIS — O0990 Supervision of high risk pregnancy, unspecified, unspecified trimester: Secondary | ICD-10-CM | POA: Diagnosis not present

## 2024-05-20 DIAGNOSIS — O24410 Gestational diabetes mellitus in pregnancy, diet controlled: Secondary | ICD-10-CM

## 2024-05-20 DIAGNOSIS — Z8489 Family history of other specified conditions: Secondary | ICD-10-CM | POA: Insufficient documentation

## 2024-05-20 DIAGNOSIS — O4403 Placenta previa specified as without hemorrhage, third trimester: Secondary | ICD-10-CM | POA: Diagnosis not present

## 2024-05-20 NOTE — Progress Notes (Signed)
" ° °  PRENATAL VISIT NOTE  Subjective:  Christine Le is a 44 y.o. G3P1011 at [redacted]w[redacted]d being seen today for ongoing prenatal care.  She is currently monitored for the following issues for this high-risk pregnancy and has Headache syndrome; History of miscarriage; Memory change; Paresthesia; Abnormal finding on antenatal screening of mother; Alopecia; Supervision of high risk pregnancy, antepartum; Family history of genetic disorder; AMA (advanced maternal age) multigravida 35+; Pregnancy with history of cesarean section, antepartum; Gestational diabetes, diet controlled; Unwanted fertility; and Placenta previa on their problem list.  Patient reports no complaints.  Contractions: Irritability. Vag. Bleeding: None.  Movement: Present. Denies leaking of fluid.   The following portions of the patient's history were reviewed and updated as appropriate: allergies, current medications, past family history, past medical history, past social history, past surgical history and problem list.   Objective:   Vitals:   05/20/24 0851  BP: 116/72  Pulse: 82  Weight: 157 lb 12.8 oz (71.6 kg)    Fetal Status:  Fetal Heart Rate (bpm): 145   Movement: Present    General: Alert, oriented and cooperative. Patient is in no acute distress.  Skin: Skin is warm and dry. No rash noted.   Cardiovascular: Normal heart rate noted  Respiratory: Normal respiratory effort, no problems with respiration noted  Abdomen: Soft, gravid, appropriate for gestational age.  Pain/Pressure: Absent     Pelvic: Cervical exam deferred        Extremities: Normal range of motion.  Edema: None  Mental Status: Normal mood and affect. Normal behavior. Normal judgment and thought content.   Assessment and Plan:  Pregnancy: G3P1011 at [redacted]w[redacted]d 1. Supervision of high risk pregnancy, antepartum (Primary) BP and FHR  - HIV antibody (with reflex) - RPR - CBC - Comp Met (CMET)  2. Pregnancy with history of cesarean section, antepartum Desires  TOLAC consent signed previously  Considering IUD instead of BTL, but still speaking with husband   3. Diet controlled gestational diabetes mellitus (GDM) in third trimester Reports fastings, majority 93-91, random 101 Pp majority 110-118, random 140 Continue checking  Had ultrasound today   4. Multigravida of advanced maternal age in third trimester Following MFM  Start antenatal testing 32 weeks   5. [redacted] weeks gestation of pregnancy Unable to stay for labs today will do third trimester labs and CMP   Preterm labor symptoms and general obstetric precautions including but not limited to vaginal bleeding, contractions, leaking of fluid and fetal movement were reviewed in detail with the patient. Please refer to After Visit Summary for other counseling recommendations.    Future Appointments  Date Time Provider Department Center  05/28/2024  8:15 AM [facility]  06/03/2024  8:55 AM Eveline Lynwood MATSU, MD [facility] None  06/16/2024  8:15 AM [facility] PROVIDER 1 [facility]  06/16/2024  8:30 AM [facility] US2 [facility]  06/24/2024  8:45 AM [facility] NST [facility]  07/01/2024  8:45 AM [facility] NST [facility]  07/08/2024  8:45 AM [facility] NST [facility]  07/15/2024  9:00 AM [facility] PROVIDER 1 [facility]  07/15/2024  9:30 AM [facility] US3 [facility]  07/22/2024  8:45 AM [facility] NST [facility]  10/12/2024  9:00 AM Ngetich, Roxan BROCKS, NP [facility] [address]  11/03/2024  8:20 AM Dolphus Reiter, MD [facility] None  12/01/2024  8:00 AM Dolphus Reiter, MD [facility] None    Nidia Daring, FNP  "

## 2024-05-20 NOTE — Progress Notes (Signed)
 Pt presents for ROB visit. Pt wants to talk about breast feeding.

## 2024-05-20 NOTE — Progress Notes (Unsigned)
 "   Patient was seen for Gestational Diabetes on *** Start time *** and End time ***   Estimated due date: 08/02/2024; [redacted]w[redacted]d   Clinical: Medications:   Current Outpatient Medications:    Accu-Chek Softclix Lancets lancets, Check 4 times daily. Once in the morning before eating, and two hours after breakfast, lunch, and dinner, Disp: 100 each, Rfl: 12   Blood Glucose Monitoring Suppl (ACCU-CHEK GUIDE) w/Device KIT, 1 Device by Does not apply route 4 (four) times daily., Disp: 1 kit, Rfl: 0   Cholecalciferol (VITAMIN D ) 2000 units CAPS, Take 1 capsule (2,000 Units total) by mouth daily., Disp: 30 capsule, Rfl: 11   folic acid (FOLVITE) 800 MCG tablet, Take 800 mcg by mouth daily.  (Patient not taking: Reported on 05/20/2024), Disp: , Rfl:    glucose blood (ACCU-CHEK GUIDE TEST) test strip, Check 4 times daily. Once in the morning before eating, and two hours after breakfast, lunch, and dinner, Disp: 100 each, Rfl: 12   Prenatal Vit-Fe Fumarate-FA (MULTIVITAMIN-PRENATAL) 27-0.8 MG TABS tablet, Take 1 tablet by mouth daily at 12 noon., Disp: , Rfl:    Past Medical History: Past Medical History:  Diagnosis Date   Chest pain 09/14/2019   Fatigue 09/14/2019   Impaired fasting blood sugar 07/12/2016   Perforation of left tympanic membrane 07/12/2016   Postoperative state 05/27/2013   Rheumatoid arthritis (HCC)    Vitamin D  deficiency 09/14/2019    Labs: OGTT fasting 86, 1 hour 181, 2 hour 151 on 02/07/2024 Lab Results  Component Value Date   HGBA1C 6.0 (H) 01/21/2024     Dietary and Lifestyle History:    12/14: Pt presents today for follow up alone with her blood sugar log sheet.    Pt reports working full time mostly walking. Pt reports she does the cooking and shopping. Pt reports intake of sugary sweetened beverages has been decreased to a couple times weekly. Pt reports she falls asleep before after dinner monitoring. Pt states she can perform 1 hour post prandial check at dinner  and will make a note on log sheet if checked at 1 hour.  Pt request refills for strips and was encouraged to request pick up from pharmacy. All Pt's questions were answered during this encounter.      Physical Activity: garden, walk at work Stress:  7 out of 10 / self care includes: meditate, think positive, talk to husband Sleep: on average hour nightly 6-7 hour     24 hr Recall:  First Meal: slice of cake, 1 banana, 1 cup of whole milk (117 mg/dL reported 2 hour post prandial) Snack: mixed nuts or boiled egg  Second meal: ~ noodles, beef, shrimp, broccoli, cilantro, water  (112 mg/dL, reported as 2 hour post prandial)  Snack:  grapes or banana Third meal:  noodles, lettuce, cucumber, pork water, whole milk  Snack:  none Beverages:  water, whole milk, juice, no sugar added coconut water    NUTRITION INTERVENTION  Nutrition education (E-1) on the following topics:    InitialFollow-up   [x]         []         Definition of Gestational Diabetes [x]         [x]         Why dietary management is important in controlling blood glucose [x]         [x]         Effects each nutrient has on blood glucose levels [x]         [  x]        Simple carbohydrates vs complex carbohydrates [x]         [x]         Fluid intake [x]         []         Creating a balanced meal plan [x]         []         Carbohydrate counting  [x]         [x]         When to check blood glucose levels [x]         []         Proper blood glucose monitoring techniques [x]         []         Effect of stress and stress reduction techniques  [x]         []         Exercise effect on blood glucose levels, appropriate exercise during pregnancy [x]         []         Importance of limiting caffeine and abstaining from alcohol and smoking [x]         []         Medications used for blood sugar control during pregnancy [x]         [x]         Hypoglycemia and rule of 15 [x]         [x]         Postpartum self care   Pt has meter was instructed to begin  testing her blood sugar before breakfast and 2 hour after each meal daily.    Patient instructed to monitor glucose levels: QID FBS: 60 - <= 95 mg/dL; 2 hour: <= 879 mg/dL   Patient received handouts: Nutrition Diabetes and Pregnancy Carbohydrate Counting List Blood glucose log Snack ideas for diabetes during pregnancy Plate Planner   Patient will be seen for follow-up: 05/28/2024   "

## 2024-05-20 NOTE — Progress Notes (Signed)
 MFM Consult Note  Christine Le is currently at [redacted]w[redacted]d. She has been followed due to advanced maternal age (44 years old) and diet-controlled gestational diabetes.  She reports that the majority of her fingerstick values have mostly been within normal limits.  Sonographic findings Single intrauterine pregnancy at 29w 3d.  Fetal cardiac activity:  Observed and appears normal. Presentation: Cephalic. Fetal biometry shows the estimated fetal weight of 3 lb 5 oz,  1497g (59%). Amniotic fluid volume: Within normal limits. AFI: 16.23cm.  MVP: 6.09 cm. Placenta: Posterior Fundal.  Gestational diabetes and advanced maternal age  She was advised to continue to monitor her fingerstick values on a daily basis.  Due to gestational diabetes and advanced maternal age, we will start weekly fetal testing at 34 weeks and continue the weekly tests until delivery.    Delivery should occur at around 39 weeks.  A follow up exam was scheduled in 4 weeks.   The patient stated that all of her questions were answered.   A total of 20 minutes was spent counseling and coordinating the care for this patient.  Greater than 50% of the time was spent in direct face-to-face contact.

## 2024-05-28 ENCOUNTER — Other Ambulatory Visit: Payer: Self-pay

## 2024-05-28 DIAGNOSIS — O24410 Gestational diabetes mellitus in pregnancy, diet controlled: Secondary | ICD-10-CM

## 2024-05-29 NOTE — Progress Notes (Unsigned)
 "   Patient was seen for Gestational Diabetes on *** Start time *** and End time ***   Estimated due date: 08/02/2024; [redacted]w[redacted]d   Clinical: Medications:   Current Outpatient Medications:    Accu-Chek Softclix Lancets lancets, Check 4 times daily. Once in the morning before eating, and two hours after breakfast, lunch, and dinner, Disp: 100 each, Rfl: 12   Blood Glucose Monitoring Suppl (ACCU-CHEK GUIDE) w/Device KIT, 1 Device by Does not apply route 4 (four) times daily., Disp: 1 kit, Rfl: 0   Cholecalciferol (VITAMIN D ) 2000 units CAPS, Take 1 capsule (2,000 Units total) by mouth daily., Disp: 30 capsule, Rfl: 11   folic acid (FOLVITE) 800 MCG tablet, Take 800 mcg by mouth daily.  (Patient not taking: Reported on 05/20/2024), Disp: , Rfl:    glucose blood (ACCU-CHEK GUIDE TEST) test strip, Check 4 times daily. Once in the morning before eating, and two hours after breakfast, lunch, and dinner, Disp: 100 each, Rfl: 12   Prenatal Vit-Fe Fumarate-FA (MULTIVITAMIN-PRENATAL) 27-0.8 MG TABS tablet, Take 1 tablet by mouth daily at 12 noon., Disp: , Rfl:    Past Medical History: Past Medical History:  Diagnosis Date   Chest pain 09/14/2019   Fatigue 09/14/2019   Impaired fasting blood sugar 07/12/2016   Perforation of left tympanic membrane 07/12/2016   Postoperative state 05/27/2013   Rheumatoid arthritis (HCC)    Vitamin D  deficiency 09/14/2019    Labs: OGTT fasting 86, 1 hour 181, 2 hour 151 on 02/07/2024 Lab Results  Component Value Date   HGBA1C 6.0 (H) 01/21/2024     Dietary and Lifestyle History:    12/14: Pt presents today for follow up alone with her blood sugar log sheet.    Pt reports working full time mostly walking. Pt reports she does the cooking and shopping. Pt reports intake of sugary sweetened beverages has been decreased to a couple times weekly. Pt reports she falls asleep before after dinner monitoring. Pt states she can perform 1 hour post prandial check at dinner  and will make a note on log sheet if checked at 1 hour.  Pt request refills for strips and was encouraged to request pick up from pharmacy. All Pt's questions were answered during this encounter.      Physical Activity: garden, walk at work Stress:  7 out of 10 / self care includes: meditate, think positive, talk to husband Sleep: on average hour nightly 6-7 hour     24 hr Recall:  First Meal: slice of cake, 1 banana, 1 cup of whole milk (117 mg/dL reported 2 hour post prandial) Snack: mixed nuts or boiled egg  Second meal: ~ noodles, beef, shrimp, broccoli, cilantro, water  (112 mg/dL, reported as 2 hour post prandial)  Snack:  grapes or banana Third meal:  noodles, lettuce, cucumber, pork water, whole milk  Snack:  none Beverages:  water, whole milk, juice, no sugar added coconut water    NUTRITION INTERVENTION  Nutrition education (E-1) on the following topics:    InitialFollow-up   [x]         []         Definition of Gestational Diabetes [x]         [x]         Why dietary management is important in controlling blood glucose [x]         [x]         Effects each nutrient has on blood glucose levels [x]         [  x]        Simple carbohydrates vs complex carbohydrates [x]         [x]         Fluid intake [x]         []         Creating a balanced meal plan [x]         []         Carbohydrate counting  [x]         [x]         When to check blood glucose levels [x]         []         Proper blood glucose monitoring techniques [x]         []         Effect of stress and stress reduction techniques  [x]         []         Exercise effect on blood glucose levels, appropriate exercise during pregnancy [x]         []         Importance of limiting caffeine and abstaining from alcohol and smoking [x]         []         Medications used for blood sugar control during pregnancy [x]         [x]         Hypoglycemia and rule of 15 [x]         [x]         Postpartum self care   Pt has meter was instructed to begin  testing her blood sugar before breakfast and 2 hour after each meal daily.    Patient instructed to monitor glucose levels: QID FBS: 60 - <= 95 mg/dL; 2 hour: <= 879 mg/dL   Patient received handouts: Nutrition Diabetes and Pregnancy Carbohydrate Counting List Blood glucose log Snack ideas for diabetes during pregnancy Plate Planner   Patient will be seen for follow-up: 05/28/2024   "

## 2024-06-02 ENCOUNTER — Ambulatory Visit: Payer: Self-pay | Admitting: Rheumatology

## 2024-06-03 ENCOUNTER — Encounter: Payer: Self-pay | Admitting: Obstetrics & Gynecology

## 2024-06-04 ENCOUNTER — Other Ambulatory Visit

## 2024-06-04 DIAGNOSIS — O24410 Gestational diabetes mellitus in pregnancy, diet controlled: Secondary | ICD-10-CM

## 2024-06-16 ENCOUNTER — Ambulatory Visit

## 2024-06-24 ENCOUNTER — Ambulatory Visit

## 2024-07-01 ENCOUNTER — Other Ambulatory Visit

## 2024-07-08 ENCOUNTER — Other Ambulatory Visit

## 2024-07-15 ENCOUNTER — Ambulatory Visit

## 2024-07-15 ENCOUNTER — Other Ambulatory Visit

## 2024-07-22 ENCOUNTER — Ambulatory Visit

## 2024-10-12 ENCOUNTER — Encounter: Payer: Self-pay | Admitting: Family

## 2024-11-03 ENCOUNTER — Ambulatory Visit: Admitting: Rheumatology

## 2024-12-01 ENCOUNTER — Ambulatory Visit: Admitting: Rheumatology
